# Patient Record
Sex: Female | Born: 1960
Health system: Southern US, Community
[De-identification: ages and names within clinical notes are randomized; demographics above are authoritative.]

## PROBLEM LIST (undated history)

## (undated) DIAGNOSIS — K5900 Constipation, unspecified: Secondary | ICD-10-CM

## (undated) DIAGNOSIS — F319 Bipolar disorder, unspecified: Secondary | ICD-10-CM

## (undated) DIAGNOSIS — R51 Headache: Secondary | ICD-10-CM

## (undated) DIAGNOSIS — F419 Anxiety disorder, unspecified: Secondary | ICD-10-CM

## (undated) DIAGNOSIS — E039 Hypothyroidism, unspecified: Secondary | ICD-10-CM

## (undated) DIAGNOSIS — R1013 Epigastric pain: Secondary | ICD-10-CM

## (undated) DIAGNOSIS — R140 Abdominal distension (gaseous): Secondary | ICD-10-CM

## (undated) DIAGNOSIS — R600 Localized edema: Secondary | ICD-10-CM

## (undated) DIAGNOSIS — Z1211 Encounter for screening for malignant neoplasm of colon: Secondary | ICD-10-CM

## (undated) DIAGNOSIS — R1310 Dysphagia, unspecified: Secondary | ICD-10-CM

## (undated) DIAGNOSIS — F329 Major depressive disorder, single episode, unspecified: Secondary | ICD-10-CM

## (undated) DIAGNOSIS — F32A Depression, unspecified: Secondary | ICD-10-CM

## (undated) DIAGNOSIS — K2210 Ulcer of esophagus without bleeding: Secondary | ICD-10-CM

## (undated) DIAGNOSIS — R109 Unspecified abdominal pain: Secondary | ICD-10-CM

## (undated) DIAGNOSIS — K219 Gastro-esophageal reflux disease without esophagitis: Secondary | ICD-10-CM

## (undated) DIAGNOSIS — R05 Cough: Secondary | ICD-10-CM

## (undated) HISTORY — DX: Localized edema: R60.0

## (undated) HISTORY — DX: Ulcer of esophagus without bleeding: K22.10

## (undated) HISTORY — DX: Epigastric pain: R10.13

## (undated) HISTORY — DX: Cough: R05

## (undated) HISTORY — DX: Unspecified abdominal pain: R10.9

## (undated) HISTORY — PX: OTHER SURGICAL HISTORY: SHX169

## (undated) HISTORY — DX: Bipolar disorder, unspecified: F31.9

## (undated) HISTORY — PX: CHOLECYSTECTOMY: SHX55

## (undated) HISTORY — DX: Constipation, unspecified: K59.00

## (undated) HISTORY — DX: Gastro-esophageal reflux disease without esophagitis: K21.9

## (undated) HISTORY — DX: Dysphagia, unspecified: R13.10

## (undated) HISTORY — DX: Abdominal distension (gaseous): R14.0

## (undated) HISTORY — DX: Encounter for screening for malignant neoplasm of colon: Z12.11

---

## 1999-08-31 ENCOUNTER — Emergency Department (HOSPITAL_COMMUNITY): Admission: EM | Admit: 1999-08-31 | Discharge: 1999-08-31 | Payer: Self-pay | Admitting: *Deleted

## 1999-08-31 ENCOUNTER — Inpatient Hospital Stay (HOSPITAL_COMMUNITY): Admission: EM | Admit: 1999-08-31 | Discharge: 1999-09-03 | Payer: Self-pay | Admitting: *Deleted

## 1999-09-04 ENCOUNTER — Other Ambulatory Visit: Admission: RE | Admit: 1999-09-04 | Discharge: 1999-09-15 | Payer: Self-pay

## 2001-01-28 ENCOUNTER — Ambulatory Visit (HOSPITAL_COMMUNITY): Admission: RE | Admit: 2001-01-28 | Discharge: 2001-01-28 | Payer: Self-pay | Admitting: Endocrinology

## 2001-01-29 ENCOUNTER — Encounter: Payer: Self-pay | Admitting: Endocrinology

## 2001-08-25 ENCOUNTER — Other Ambulatory Visit: Admission: RE | Admit: 2001-08-25 | Discharge: 2001-08-25 | Payer: Self-pay | Admitting: Obstetrics and Gynecology

## 2002-10-01 ENCOUNTER — Encounter (INDEPENDENT_AMBULATORY_CARE_PROVIDER_SITE_OTHER): Payer: Self-pay | Admitting: Specialist

## 2002-10-01 ENCOUNTER — Observation Stay (HOSPITAL_COMMUNITY): Admission: RE | Admit: 2002-10-01 | Discharge: 2002-10-02 | Payer: Self-pay | Admitting: General Surgery

## 2002-10-01 ENCOUNTER — Encounter: Payer: Self-pay | Admitting: General Surgery

## 2002-12-03 ENCOUNTER — Ambulatory Visit (HOSPITAL_COMMUNITY): Admission: RE | Admit: 2002-12-03 | Discharge: 2002-12-03 | Payer: Self-pay | Admitting: Endocrinology

## 2005-05-24 ENCOUNTER — Emergency Department (HOSPITAL_COMMUNITY): Admission: EM | Admit: 2005-05-24 | Discharge: 2005-05-24 | Payer: Self-pay | Admitting: Emergency Medicine

## 2005-09-12 ENCOUNTER — Inpatient Hospital Stay (HOSPITAL_COMMUNITY): Admission: EM | Admit: 2005-09-12 | Discharge: 2005-09-16 | Payer: Self-pay | Admitting: Psychiatry

## 2005-09-12 ENCOUNTER — Emergency Department (HOSPITAL_COMMUNITY): Admission: EM | Admit: 2005-09-12 | Discharge: 2005-09-12 | Payer: Self-pay | Admitting: Emergency Medicine

## 2005-09-12 ENCOUNTER — Ambulatory Visit: Payer: Self-pay | Admitting: Psychiatry

## 2005-09-17 ENCOUNTER — Other Ambulatory Visit (HOSPITAL_COMMUNITY): Admission: RE | Admit: 2005-09-17 | Discharge: 2005-10-01 | Payer: Self-pay | Admitting: Psychiatry

## 2005-10-26 ENCOUNTER — Ambulatory Visit: Payer: Self-pay | Admitting: Psychiatry

## 2005-11-20 ENCOUNTER — Ambulatory Visit (HOSPITAL_COMMUNITY): Payer: Self-pay | Admitting: Psychiatry

## 2005-11-20 ENCOUNTER — Ambulatory Visit: Payer: Self-pay | Admitting: Psychiatry

## 2006-01-04 ENCOUNTER — Ambulatory Visit (HOSPITAL_COMMUNITY): Payer: Self-pay | Admitting: Psychiatry

## 2006-03-01 ENCOUNTER — Ambulatory Visit (HOSPITAL_COMMUNITY): Payer: Self-pay | Admitting: Psychiatry

## 2006-05-03 ENCOUNTER — Ambulatory Visit (HOSPITAL_COMMUNITY): Payer: Self-pay | Admitting: Psychiatry

## 2006-08-09 ENCOUNTER — Ambulatory Visit (HOSPITAL_COMMUNITY): Payer: Self-pay | Admitting: *Deleted

## 2006-09-19 ENCOUNTER — Ambulatory Visit (HOSPITAL_COMMUNITY): Admission: RE | Admit: 2006-09-19 | Discharge: 2006-09-19 | Payer: Self-pay | Admitting: Family Medicine

## 2006-11-01 ENCOUNTER — Ambulatory Visit (HOSPITAL_COMMUNITY): Payer: Self-pay | Admitting: Psychiatry

## 2006-12-08 ENCOUNTER — Inpatient Hospital Stay (HOSPITAL_COMMUNITY): Admission: EM | Admit: 2006-12-08 | Discharge: 2006-12-09 | Payer: Self-pay | Admitting: Psychiatry

## 2006-12-08 ENCOUNTER — Ambulatory Visit: Payer: Self-pay | Admitting: Psychiatry

## 2006-12-12 ENCOUNTER — Ambulatory Visit (HOSPITAL_COMMUNITY): Payer: Self-pay | Admitting: Psychiatry

## 2007-04-18 ENCOUNTER — Ambulatory Visit (HOSPITAL_COMMUNITY): Payer: Self-pay | Admitting: Psychiatry

## 2007-05-15 ENCOUNTER — Ambulatory Visit (HOSPITAL_COMMUNITY): Payer: Self-pay | Admitting: Psychiatry

## 2007-05-19 ENCOUNTER — Ambulatory Visit: Payer: Self-pay | Admitting: Psychiatry

## 2007-05-19 ENCOUNTER — Other Ambulatory Visit (HOSPITAL_COMMUNITY): Admission: RE | Admit: 2007-05-19 | Discharge: 2007-06-02 | Payer: Self-pay | Admitting: Psychiatry

## 2007-06-19 ENCOUNTER — Ambulatory Visit (HOSPITAL_COMMUNITY): Payer: Self-pay | Admitting: Psychiatry

## 2007-10-06 ENCOUNTER — Emergency Department (HOSPITAL_COMMUNITY): Admission: EM | Admit: 2007-10-06 | Discharge: 2007-10-06 | Payer: Self-pay | Admitting: Emergency Medicine

## 2007-10-07 ENCOUNTER — Ambulatory Visit (HOSPITAL_COMMUNITY): Admission: RE | Admit: 2007-10-07 | Discharge: 2007-10-07 | Payer: Self-pay | Admitting: Family Medicine

## 2007-10-31 ENCOUNTER — Encounter (HOSPITAL_COMMUNITY): Admission: RE | Admit: 2007-10-31 | Discharge: 2007-11-30 | Payer: Self-pay | Admitting: Family Medicine

## 2008-03-10 ENCOUNTER — Ambulatory Visit (HOSPITAL_COMMUNITY): Payer: Self-pay | Admitting: Psychiatry

## 2008-09-03 ENCOUNTER — Ambulatory Visit (HOSPITAL_COMMUNITY): Payer: Self-pay | Admitting: Psychiatry

## 2009-02-08 ENCOUNTER — Ambulatory Visit (HOSPITAL_COMMUNITY): Admission: RE | Admit: 2009-02-08 | Discharge: 2009-02-08 | Payer: Self-pay | Admitting: Endocrinology

## 2009-02-19 ENCOUNTER — Encounter: Admission: RE | Admit: 2009-02-19 | Discharge: 2009-02-19 | Payer: Self-pay | Admitting: Endocrinology

## 2009-02-25 ENCOUNTER — Ambulatory Visit (HOSPITAL_COMMUNITY): Payer: Self-pay | Admitting: Psychiatry

## 2009-08-31 ENCOUNTER — Ambulatory Visit (HOSPITAL_COMMUNITY): Payer: Self-pay | Admitting: Psychiatry

## 2010-02-21 ENCOUNTER — Ambulatory Visit (HOSPITAL_COMMUNITY): Payer: Self-pay | Admitting: Psychiatry

## 2010-08-30 ENCOUNTER — Ambulatory Visit (HOSPITAL_COMMUNITY): Payer: Self-pay | Admitting: Psychiatry

## 2011-02-23 ENCOUNTER — Encounter (HOSPITAL_COMMUNITY): Payer: 59 | Admitting: Physician Assistant

## 2011-02-23 DIAGNOSIS — F319 Bipolar disorder, unspecified: Secondary | ICD-10-CM

## 2011-04-03 LAB — BASIC METABOLIC PANEL
BUN: 7 mg/dL (ref 6–23)
CO2: 25 mEq/L (ref 19–32)
Calcium: 9.2 mg/dL (ref 8.4–10.5)
Chloride: 106 mEq/L (ref 96–112)
Creatinine, Ser: 0.8 mg/dL (ref 0.4–1.2)
GFR calc Af Amer: >60 mL/min (ref >60)
Sodium: 137 mEq/L (ref 135–145)

## 2011-04-03 LAB — ACTH STIMULATION, 3 TIME POINTS
Cortisol, 60 Min: 29.2 ug/dL (ref >20)
Cortisol, Base: 11.5 ug/dL

## 2011-05-04 NOTE — Discharge Summary (Signed)
Whitney Lopez, Whitney Lopez               ACCOUNT NO.:  [id number]   MEDICAL RECORD NO.:  [id number]          PATIENT TYPE:  IPS   LOCATION:  0501                          [facility]ACILITY[facility]  [facility]   PHYSICIAN:  Jeanice Lim, M.D. DATE OF BIRTH:  [birth year]   DATE OF ADMISSION:  09/12/2005  DATE OF DISCHARGE:  09/16/2005                                 DISCHARGE SUMMARY   IDENTIFYING DATA:  This is a 50 year old widowed Caucasian female  voluntarily admitted, presenting with history of suicidal thoughts.  Reporting recent overdose on Sunday of Xanax and Valium, tried to asphyxiate  herself with carbon monoxide.  Lost husband in March, unable to cope, cannot  concentrate, unable to go to work, having some racing thoughts.  Had  intentionally lost 61 pounds for health reasons.   MEDICATIONS:  Synthroid, Cytomel, triamterene, Xanax.   ALLERGIES:  PENICILLIN.   PHYSICAL EXAMINATION:  Physical and neurologic exam within normal limits.   LABORATORY DATA:  Routine admission labs within normal limits.   MENTAL STATUS EXAM:  Alert, cooperative.  Good eye contact.  Speech clear.  Mood depressed, anxious.  Affect tearful at times.  Thought processes goal  directed with some agitated thinking.  Cognitively intact.  Judgment and  insight fair.   ADMISSION DIAGNOSES:  AXIS I:  Major depressive disorder, recurrent, severe.  AXIS II:  Deferred.  AXIS III:  Thyroid history.  AXIS IV:  Moderate (problems with limited support system, psychosocial  issues).  AXIS V:  30/65.   HOSPITAL COURSE:  The patient was admitted and ordered routine p.r.n.  medications and underwent further monitoring.  Was encouraged to participate  in individual, group and milieu therapy.  Was monitored for safety.  Evaluated regarding medications and arranged for intensive outpatient  treatment.  Participated in aftercare planning.  Reported improvement in  sleeping, resolution of suicidal ideation.  Pressured speech and racing  thoughts slowed down and patient felt stable.   CONDITION ON DISCHARGE:  The patient was discharge feeling I'm 100%  better.  The patient was discharged in improved condition.  Mood was  euthymic.  No lability.  Thought processes goal directed with no pressure or  flight of ideas.  No dangerous ideation.  No psychotic symptoms.  The  patient was given medication education.  Recommended not to take Cytomel and  to follow up with Dr. __________ on September 18, 2005 at 1:45 p.m. due to  questionable slightly hyperthyroid state and patient, again, was given  medication education.   DISCHARGE MEDICATIONS:  1.  Risperdal 0.5 mg, 1 at 8 p.m.  2.  Lamictal 25 mg in the morning.  3.  Topamax 25 mg at night.  4.  Seroquel 100 mg at night.  5.  Ambien 10 mg, 1-1/2 at night.  6.  Synthroid 100 mcg on Monday, Wednesday and Friday.  7.  Synthroid 200 mcg on Thursday, Saturday and Sunday.   Again, hold Cytomel until follow up with outpatient Gizelle Whetsel.   1.  Maxzide 37.5/25 mg in the morning.   FOLLOW UP:  The patient was to follow up with [facility]  __________ and Jonny Ruiz  __________ at [facility] on October 11th at 9:30 a.m. and October 6th at 10  a.m.  The patient was to start with intensive outpatient at [facility] at 9 a.m.   DISCHARGE DIAGNOSES:  AXIS I:  Major depressive disorder, recurrent, severe.  AXIS II:  Deferred.  AXIS III:  Thyroid history.  AXIS IV:  Moderate (problems with limited support system, psychosocial  issues).  AXIS V:  GAF on discharge 55.      Jeanice Lim, M.D.  Electronically Signed     JEM/MEDQ  D:  10/19/2005  T:  10/19/2005  Job:  [id number]

## 2011-05-04 NOTE — Op Note (Signed)
NAME:  Whitney Lopez, Whitney Lopez                         ACCOUNT NO.:  [id number]   MEDICAL RECORD NO.:  [id number]                   PATIENT TYPE:  OBV   LOCATION:  0440                                 FACILITY:  [facility]   PHYSICIAN:  Ollen Gross. Vernell Morgans, M.D.              DATE OF BIRTH:  Apr 22, 1961   DATE OF PROCEDURE:  10/01/2002  DATE OF DISCHARGE:  10/02/2002                                 OPERATIVE REPORT   PREOPERATIVE DIAGNOSES:  Cholelithiasis.   POSTOPERATIVE DIAGNOSES:  Cholelithiasis.   PROCEDURE:  Laparoscopic cholecystectomy with intraoperative cholangiogram.   SURGEON:  Ollen Gross. Carolynne Edouard, M.D.   ASSISTANT:  Anselm Pancoast. Zachery Dakins, M.D.   ANESTHESIA:  General endotracheal.   DESCRIPTION OF PROCEDURE:  After informed consent was obtained, the patient  was brought to the operating room, placed in supine position on the  operating room table. After adequate induction of general anesthesia, the  patient's abdomen was prepped with Betadine and draped in the usual sterile  manner. The area of the umbilicus was infiltrated with 1% lidocaine with a  0.25% Marcaine. A small incision was made with a 15 blade knife. This  incision was carried down through the subcutaneous tissue bluntly with Kelly  clamps and [employer] retractors until the linea alba was identified. The  linea alba was incised with a 15 blade knife and each side was grasped with  Kocher clamps and elevated anteriorly. The preperitoneal space was then  probed bluntly with a hemostat until the peritoneum was opened and access  was gained to the abdominal cavity. A finger was inserted through this  opening and there were no obvious adhesions to the anterior abdominal wall.  A #0 Vicryl pursestring stitch was placed in the fascia surrounding this  opening. A Hasson cannula was then placed through this opening and anchored  in place with the previously placed Vicryl pursestring stitch. The abdomen  was then insufflated with carbon  dioxide without difficulty. The patient was  placed in the head up position and the laparoscope was placed through the  Hasson cannula. The dome of the gallbladder and liver edge were readily  identified. Next after infiltrating the area with 0.25% Marcaine, a small  midline transverse incision was made with a 15 blade knife. A 10 mm port was  then placed bluntly through this incision into the abdominal cavity under  direct vision. Sites were then chosen laterally on the right side of the  abdomen for placement of 5 mm ports and each of these areas was infiltrated  with 0.25% Marcaine and small stab incisions were made with a 15 blade  knife. The 5 mm ports were then placed bluntly through these incisions into  the abdominal cavity under direct vision. A blunt grasper was placed through  the lateral most 5 mm port and used to grasp the dome of the gallbladder and  elevate it anteriorly and superiorly. Another  blunt grasper was placed  through the other 5 mm port and used to retract on the body and neck of the  gallbladder. The dissector was then placed through the upper midline port  and the peritoneal reflection at the gallbladder neck was then opened using  the electrocautery. Blunt dissection was then carried out in this area until  the gallbladder neck cystic duct junction was readily identified and  dissected in a circumferential manner. A good window was created and care  was taken to keep the common duct medial to this dissection. A clip was  placed on the gallbladder neck. A small ductotomy was made with the  laparoscopic scissors. A 12 gauge angiocath was placed percutaneously  through the anterior abdominal wall and the Reddick catheter was placed  through the angiocath. The Reddick catheter was placed within the cystic  duct and anchored in place with the clip. A cholangiogram was obtained that  showed no filling defects in the biliary system and good emptying into the  duodenum.  The anchoring clip and Reddick catheter and angiocath were then  removed from the patient. Three clips were then placed proximally on the  cystic duct and the duct was divided between the two. Posterior to this, the  cystic artery was identified and again dissected in a circumferential manner  bluntly until a good window was created. Two clips were placed proximally  and one distally on the artery and the artery was divided between the two.  Next, the laparoscopic Adriana Simas device was used to separate the gallbladder from  the liver bed. Prior to completely detaching the gallbladder from the liver  bed, the liver bed was inspected and several small bleeding points were  coagulated with the electrocautery. The gallbladder was then detached the  rest of the way from the liver bed. An endoscopic bag was placed through the  upper midline port and the gallbladder was placed within the bag and sealed.  The laparoscope was then moved to the upper midline port and a gallbladder  grasper was placed through the Hasson cannula and used to grasp the opening  of the bag. The bag with the gallbladder was then removed with the Hasson  cannula through the infraumbilical port. The fascial defect was then closed  with the previously placed Vicryl pursestring stitch. The abdomen was then  irrigated with copious amounts of saline until the affluent was clear. The  liver bed was inspected again and found to be hemostatic. The ports were  then removed and the gas was allowed to escape. The incisions were then  closed with interrupted 4-0 Monocryl subcuticular stitches. Benzoin and  Steri-Strips and sterile dressings were applied. The patient tolerated the  procedure well. At the end of the case, all sponge, needle and instrument  counts were correct. The patient was then awakened and taken to the recovery  room in stable condition.                                               Ollen Gross. Vernell Morgans, M.D.    PST/MEDQ   D:  10/12/2002  T:  10/12/2002  Job:  [id number]

## 2011-05-04 NOTE — Discharge Summary (Signed)
Whitney Lopez, Whitney Lopez               ACCOUNT NO.:  [id number]   MEDICAL RECORD NO.:  [id number]          PATIENT TYPE:  IPS   LOCATION:  0301                          [facility]ILITY[facility]  [facility]   PHYSICIAN:  Anselm Jungling, MD  DATE OF BIRTH:  Jun 26, 1961   DATE OF ADMISSION:  12/08/2006  DATE OF DISCHARGE:  12/09/2006                               DISCHARGE SUMMARY   IDENTIFYING DATA AND REASON FOR ADMISSION:  The patient is a 50 year old  single white female.  This was her second [facility] admission.  She again  overdosed, this time on 120 Vistaril.  She had been feeling hopeless and  overwhelmed following the death of her husband.  Please refer to the  admission note for further details pertaining to the symptoms,  circumstances and history that led to her hospitalization.  She was  given an initial Axis I diagnosis of major depressive disorder,  recurrent, and bereavement.   MEDICAL AND LABORATORY:  The patient was medically and physically  assessed by the psychiatric nurse practitioner.  She had a history of  thyroid disease, and was continued on her usual Cytomel 10 mg daily.  She was continued on a regimen of Synthroid 100 mcg daily alternating  with 200 mcg daily.  There were no acute medical issues.   HOSPITAL COURSE:  The patient was admitted to the adult inpatient  psychiatric service.  She presented as a fully alert adult female who  was coherent, with intact cognition and good judgment and insight.  She  indicated that she did not feel she needed the inpatient hospital on the  morning of December 09, 2006.  A family session was arranged for that  afternoon.  The patient's mother and stepfather were present, along with  the patient.  The discharge plan was put in place including safety  precautions.  The plan was to stay with her friend Lynden Ang with safety  checks by parents via telephone or in person twice daily as long as  needed.  Thereafter, the patient would be going back to work  and  beginning school.  The patient reported not wanting mother's assistance  except for telephone contact, but agreed to her stepfather's assistance.  The patient and stepfather agreed to continue contact on a regular basis  rather than the patient contacting him only in a crisis.  The meeting  went on with discussion of the patient having an appointment with a  psychiatrist and the plan to increase her monthly sessions with her  outpatient therapist to weekly for 4 weeks, and then assess the need for  continued weekly sessions as needed.  Both psychiatrist and therapist  had been contacted regarding the suicide attempt.  The patient also  agreed to join a support group for bipolar disorder.  She made it a goal  in therapy to address her depression and grief, pertaining to the loss  of her husband that occurred less than 2 years prior.  There was also  discussion about the possibility of family therapy conjointly with her  mother at some later date.   Following this meeting,  the patient was discharged per the plan above.   AFTERCARE:  The patient was to follow-up for medication management with  Jorje Guild, appointment time to be arranged.   DISCHARGE MEDICATIONS:  Lamictal 150 mg daily, Risperdal 1 mg q.h.s.,  Cytomel 10 mg q.h.s., and Synthroid 100 mcg every other day alternating  with 200 mcg every other day.   DISCHARGE DIAGNOSES:  AXIS I: Bipolar disorder, type 2.   AXIS II: Deferred.   AXIS III: Thyroid disorder.   AXIS IV: Stressors severe.   AXIS V: GAF on discharge 65.      Anselm Jungling, MD  Electronically Signed     SPB/MEDQ  D:  12/27/2006  T:  12/29/2006  Job:  [id number]

## 2011-09-04 ENCOUNTER — Encounter (HOSPITAL_COMMUNITY): Payer: 59 | Admitting: Physician Assistant

## 2011-10-03 ENCOUNTER — Encounter (HOSPITAL_COMMUNITY): Payer: 59 | Admitting: Physician Assistant

## 2011-10-03 DIAGNOSIS — F319 Bipolar disorder, unspecified: Secondary | ICD-10-CM

## 2011-10-11 ENCOUNTER — Ambulatory Visit (INDEPENDENT_AMBULATORY_CARE_PROVIDER_SITE_OTHER): Payer: 59 | Admitting: Gastroenterology

## 2011-10-11 ENCOUNTER — Encounter: Payer: Self-pay | Admitting: Gastroenterology

## 2011-10-11 VITALS — BP 108/68 | HR 77 | Temp 98.0°F | Ht 70.0 in | Wt 224.6 lb

## 2011-10-11 DIAGNOSIS — R1013 Epigastric pain: Secondary | ICD-10-CM

## 2011-10-11 HISTORY — DX: Epigastric pain: R10.13

## 2011-10-11 MED ORDER — OMEPRAZOLE 20 MG PO CPDR: 20.0000 mg | DELAYED_RELEASE_CAPSULE | Freq: Every day | ORAL | Status: DC

## 2011-10-11 NOTE — Assessment & Plan Note (Signed)
50 year old female with 7 month hx of early satiety, nausea, and epigastric discomfort after eating. Feels like a "knot", relieved at times by laying down and stretching. No vomiting, does report sensation of food "sitting heavy" in epigastric region. No reflux. No routine use of NSAIDs or aspirin powders. Denies melena or hematochezia. No anemia on labs, H.pylori antibody negative. Needs EGD for further evaluation of symptoms. Will institute PPI in interim.   Proceed with upper endoscopy in the near future with Dr. Jena Gauss. The risks, benefits, and alternatives have been discussed in detail with patient. They have stated understanding and desire to proceed.  Prilosec 20 mg daily Further recommendations to follow in the future

## 2011-10-11 NOTE — Progress Notes (Signed)
Referring Provider: Cassell Smiles., MD Primary Care Physician:  Cassell Smiles., MD Primary Gastroenterologist:  Dr. Jena Gauss   Chief Complaint  Patient presents with  . Nausea  . Pain    food stops in center of chest    HPI:   Ms. Whitney Lopez is a pleasant 50 year old female who presents today at the request of Dr. Sherwood Gambler secondary to early satiety and nausea. Her husband was actually a patient of ours several years ago; he unfortunately succumbed to what sounds like some type of cancer. She is quite pleased with Dr. Jena Gauss and would like him to take care of her as well. She reports a 7 month history of early satiety, nausea, epigastric discomfort after eating. Described as a "knot". Denies any vomiting. She feels like food "sits" in the epigastric region. At times she will lay down to feel better. Denies any reflux. No routine use of NSAIDs or aspirin powders. Denies melena or hematochezia.  She has never had an EGD.  As of note, she does report chronic lower extremity edema, since age 63. She states no one has been able to pinpoint the problem.   Outside labs Oct 2012: H.pylori antibody negative Hgb, Hct normal  Past Medical History  Diagnosis Date  . Bipolar disorder   . Lower extremity edema     Past Surgical History  Procedure Date  . S/p hysterectomy   . Clavicle repair   . Cholecystectomy     Current Outpatient Prescriptions  Medication Sig Dispense Refill  . lamoTRIgine (LAMICTAL) 200 MG tablet Take 200 mg by mouth daily.        Marland Kitchen levothyroxine (SYNTHROID, LEVOTHROID) 175 MCG tablet Take 175 mcg by mouth daily.        . risperiDONE (RISPERDAL) 2 MG tablet Take 2 mg by mouth 2 (two) times daily.        Marland Kitchen omeprazole (PRILOSEC) 20 MG capsule Take 1 capsule (20 mg total) by mouth daily.  31 capsule  1    Allergies as of 10/11/2011 - Review Complete 10/11/2011  Allergen Reaction Noted  . Penicillins Hives 10/11/2011    Family History  Problem Relation Age of Onset   . Colon cancer Neg Hx   . Ulcers Mother     History   Social History  . Marital Status: Widowed    Spouse Name: N/A    Number of Children: 0  . Years of Education: N/A   Occupational History  .  [employer]   Social History Main Topics  . Smoking status: Current Everyday Smoker -- 1.0 packs/day    Types: Cigarettes  . Smokeless tobacco: Not on file  . Alcohol Use: No  . Drug Use: No  . Sexually Active: Not on file   Other Topics Concern  . Not on file   Social History Narrative  . No narrative on file    Review of Systems: Gen: Denies any fever, chills, loss of appetite, fatigue, weight loss. CV: Denies chest pain, heart palpitations, syncope, peripheral edema. Resp: Denies shortness of breath with rest, cough, wheezing GI: SEE HPI GU : Denies urinary burning, urinary frequency, urinary incontinence.  MS: Denies joint pain, muscle weakness, cramps, limited movement Derm: Denies rash, itching, dry skin Psych: Denies depression, anxiety, confusion or memory loss  Heme: Denies bruising, bleeding, and enlarged lymph nodes.  Physical Exam: BP 108/68  Pulse 77  Temp(Src) 98 F (36.7 C) (Temporal)  Ht 5\' 10"  (1.778 m)  Wt 224 lb 9.6 oz (101.878  kg)  BMI 32.23 kg/m2 General:   Alert and oriented. Well-developed, well-nourished, pleasant and cooperative. Head:  Normocephalic and atraumatic. Eyes:  Conjunctiva pink, sclera clear, no icterus.    Ears:  Normal auditory acuity. Nose:  No deformity, discharge,  or lesions. Mouth:  No deformity or lesions, mucosa pink and moist.  Neck:  Supple, without mass or thyromegaly. Lungs:  Clear to auscultation bilaterally, without wheezing, rales, or rhonchi.  Heart:  S1, S2 present without murmurs noted.  Abdomen:  +BS, soft, non-tender and non-distended. Without mass or HSM. No rebound or guarding. No hernias noted. Rectal:  Deferred  Msk:  Symmetrical without gross deformities. Normal posture. Extremities:  Without  clubbing. Bilateral lower extremity edema extending from knee to foot, significant pedal edema, no significant pitting. No venous stasis changes.  Neurologic:  Alert and  oriented x4;  grossly normal neurologically. Skin:  Intact, warm and dry without significant lesions or rashes Cervical Nodes:  No significant cervical adenopathy. Psych:  Alert and cooperative. Normal mood and affect.

## 2011-10-11 NOTE — Patient Instructions (Signed)
Start taking Prilosec 30 minutes before your first meal of the day.   We have set you up with Dr. Jena Gauss for an upper endoscopy.  In the meantime, contact Dr. Sherwood Gambler about the lower extremity swelling. They may need to refer you to Physical Therapy.

## 2011-10-12 NOTE — Progress Notes (Signed)
Cc to PCP 

## 2011-10-30 ENCOUNTER — Encounter (HOSPITAL_COMMUNITY): Payer: Self-pay | Admitting: Pharmacy Technician

## 2011-11-01 ENCOUNTER — Other Ambulatory Visit: Payer: Self-pay | Admitting: General Practice

## 2011-11-01 DIAGNOSIS — R1013 Epigastric pain: Secondary | ICD-10-CM

## 2011-11-01 DIAGNOSIS — R1310 Dysphagia, unspecified: Secondary | ICD-10-CM

## 2011-11-01 DIAGNOSIS — K210 Gastro-esophageal reflux disease with esophagitis: Secondary | ICD-10-CM

## 2011-11-01 MED ORDER — SODIUM CHLORIDE 0.45 % IV SOLN
Freq: Once | INTRAVENOUS | Status: AC
  Administered 2011-11-02: 09:00:00 via INTRAVENOUS

## 2011-11-02 ENCOUNTER — Encounter (HOSPITAL_COMMUNITY)
Admission: RE | Disposition: A | Discharge status: Home / Self Care | Payer: Self-pay | Source: Ambulatory Visit | Attending: Internal Medicine

## 2011-11-02 ENCOUNTER — Other Ambulatory Visit: Payer: Self-pay | Admitting: Internal Medicine

## 2011-11-02 ENCOUNTER — Encounter (HOSPITAL_COMMUNITY): Payer: Self-pay | Admitting: *Deleted

## 2011-11-02 ENCOUNTER — Ambulatory Visit (HOSPITAL_COMMUNITY)
Admission: RE | Admit: 2011-11-02 | Discharge: 2011-11-02 | Disposition: A | Discharge status: Home / Self Care | Payer: 59 | Source: Ambulatory Visit | Attending: Internal Medicine | Admitting: Internal Medicine

## 2011-11-02 DIAGNOSIS — R1310 Dysphagia, unspecified: Secondary | ICD-10-CM | POA: Insufficient documentation

## 2011-11-02 DIAGNOSIS — K2970 Gastritis, unspecified, without bleeding: Secondary | ICD-10-CM | POA: Insufficient documentation

## 2011-11-02 DIAGNOSIS — K2100 Gastro-esophageal reflux disease with esophagitis, without bleeding: Secondary | ICD-10-CM | POA: Insufficient documentation

## 2011-11-02 DIAGNOSIS — R1013 Epigastric pain: Secondary | ICD-10-CM | POA: Insufficient documentation

## 2011-11-02 DIAGNOSIS — K2990 Gastroduodenitis, unspecified, without bleeding: Secondary | ICD-10-CM | POA: Insufficient documentation

## 2011-11-02 DIAGNOSIS — R110 Nausea: Secondary | ICD-10-CM | POA: Insufficient documentation

## 2011-11-02 HISTORY — DX: Depression, unspecified: F32.A

## 2011-11-02 HISTORY — PX: MALONEY DILATION: SHX5535

## 2011-11-02 HISTORY — PX: ESOPHAGOGASTRODUODENOSCOPY: SHX5428

## 2011-11-02 HISTORY — DX: Anxiety disorder, unspecified: F41.9

## 2011-11-02 HISTORY — DX: Headache: R51

## 2011-11-02 HISTORY — DX: Major depressive disorder, single episode, unspecified: F32.9

## 2011-11-02 HISTORY — DX: Hypothyroidism, unspecified: E03.9

## 2011-11-02 SURGERY — EGD (ESOPHAGOGASTRODUODENOSCOPY)
Anesthesia: Moderate Sedation | Site: Esophagus

## 2011-11-02 MED ORDER — STERILE WATER FOR IRRIGATION IR SOLN
Status: DC | PRN
  Administered 2011-11-02: 10:00:00

## 2011-11-02 MED ORDER — MIDAZOLAM HCL 5 MG/5ML IJ SOLN
INTRAMUSCULAR | Status: DC | PRN
  Administered 2011-11-02: 1 mg via INTRAVENOUS
  Administered 2011-11-02 (×2): 2 mg via INTRAVENOUS

## 2011-11-02 MED ORDER — MEPERIDINE HCL 100 MG/ML IJ SOLN
INTRAMUSCULAR | Status: DC | PRN
  Administered 2011-11-02: 25 mg via INTRAVENOUS
  Administered 2011-11-02: 50 mg via INTRAVENOUS
  Administered 2011-11-02: 25 mg via INTRAVENOUS

## 2011-11-02 MED ORDER — BUTAMBEN-TETRACAINE-BENZOCAINE 2-2-14 % EX AERO
INHALATION_SPRAY | CUTANEOUS | Status: DC | PRN
  Administered 2011-11-02: 2 via TOPICAL

## 2011-11-02 MED ORDER — MIDAZOLAM HCL 5 MG/5ML IJ SOLN
INTRAMUSCULAR | Status: AC
  Filled 2011-11-02: qty 10

## 2011-11-02 MED ORDER — MEPERIDINE HCL 100 MG/ML IJ SOLN
INTRAMUSCULAR | Status: AC
  Filled 2011-11-02: qty 2

## 2011-11-02 NOTE — H&P (Signed)
  I have seen & examined the patient prior to the procedure(s) today and reviewed the history and physical/consultation.  There have been no changes.  After consideration of the risks, benefits, alternatives and imponderables, the patient has consented to the procedure(s).   

## 2011-11-11 ENCOUNTER — Encounter: Payer: Self-pay | Admitting: Internal Medicine

## 2011-11-12 ENCOUNTER — Encounter (HOSPITAL_COMMUNITY): Payer: Self-pay | Admitting: Internal Medicine

## 2011-11-15 ENCOUNTER — Encounter: Payer: Self-pay | Admitting: Internal Medicine

## 2011-11-30 ENCOUNTER — Encounter: Payer: Self-pay | Admitting: Urgent Care

## 2011-11-30 ENCOUNTER — Ambulatory Visit (INDEPENDENT_AMBULATORY_CARE_PROVIDER_SITE_OTHER): Payer: 59 | Admitting: Urgent Care

## 2011-11-30 VITALS — BP 97/72 | HR 77 | Temp 97.2°F | Ht 70.0 in | Wt 215.2 lb

## 2011-11-30 DIAGNOSIS — K208 Other esophagitis without bleeding: Secondary | ICD-10-CM

## 2011-11-30 DIAGNOSIS — K2210 Ulcer of esophagus without bleeding: Secondary | ICD-10-CM

## 2011-11-30 DIAGNOSIS — R1013 Epigastric pain: Secondary | ICD-10-CM

## 2011-11-30 HISTORY — DX: Ulcer of esophagus without bleeding: K22.10

## 2011-11-30 MED ORDER — DEXLANSOPRAZOLE 60 MG PO CPDR: 60.0000 mg | DELAYED_RELEASE_CAPSULE | Freq: Every day | ORAL | Status: DC

## 2011-11-30 NOTE — Assessment & Plan Note (Addendum)
Begin Dexilant 60 mg daily. Avoid NSAIDs. GERD precautions literature. Discussed raising the head of her bed and eating at least 4 hours prior to lying down in the morning after work. Office visit in one year with Dr. Jena Gauss or sooner if needed.  Screening Colonoscopy September 2014

## 2011-11-30 NOTE — Assessment & Plan Note (Signed)
Due to erosive esophagitis. Improved.

## 2011-11-30 NOTE — Progress Notes (Signed)
Cc to PCP 

## 2011-11-30 NOTE — Progress Notes (Signed)
Referring Provider: Cassell Smiles., MD Primary Care Physician:  Cassell Smiles., MD Primary Gastroenterologist:  Dr. Jena Gauss  Chief Complaint  Patient presents with  . Follow-up    Erosive reflux esophagitis    HPI:  Whitney Lopez is a 50 y.o. female here for follow up for erosive reflux esophagitis and dysphagia. She is status post esophageal dilation with a 54 [place] Maloney dilator. She tells me she received a letter about the biopsy showing mild gastritis, therefore she threw the letter away and stopped taking PPI. Tells me she is "Getting over flu."  Chicken & dumplings caused horrible acid reflux.  Dysphagia better.  Some N/V w/ flu, but now resolved.  Denies melena or hematochezia.  Appetite getting back to normal.    Past Medical History  Diagnosis Date  . Bipolar disorder   . Lower extremity edema   . Hypothyroidism   . Headache   . Depression   . Anxiety   . GERD (gastroesophageal reflux disease)     Erosive reflux esophagitis    Past Surgical History  Procedure Date  . S/p hysterectomy   . Clavicle repair   . Cholecystectomy   . Abdominal hysterectomy   . Esophagogastroduodenoscopy 11/02/2011    Four-quadrant erosive reflux esophagitis, gastric and bulbar erosions, H. pylori negative  . Maloney dilation 11/02/2011    15F    Current Outpatient Prescriptions  Medication Sig Dispense Refill  . ALPRAZolam (XANAX) 0.5 MG tablet Take 0.5 mg by mouth 3 (three) times daily as needed. For anxiety       . diazepam (VALIUM) 10 MG tablet Take 10 mg by mouth every 6 (six) hours as needed. For spasms       . ibuprofen (ADVIL,MOTRIN) 200 MG tablet Take 400 mg by mouth every 6 (six) hours as needed. For pain       . lamoTRIgine (LAMICTAL) 200 MG tablet Take 200 mg by mouth daily.        Marland Kitchen levothyroxine (SYNTHROID, LEVOTHROID) 175 MCG tablet Take 175 mcg by mouth daily.        . pseudoephedrine (SINUS & ALLERGY 12 HOUR) 120 MG 12 hr tablet Take 120 mg by mouth every 12  (twelve) hours as needed. For allergies       . risperiDONE (RISPERDAL) 2 MG tablet Take 2 mg by mouth daily.       Marland Kitchen dexlansoprazole (DEXILANT) 60 MG capsule Take 1 capsule (60 mg total) by mouth daily.  31 capsule  11    Allergies as of 11/30/2011 - Review Complete 11/30/2011  Allergen Reaction Noted  . Penicillins Hives 10/11/2011  Review of Systems: Gen: Denies any fever, chills, sweats, anorexia, fatigue, weakness, malaise, weight loss, and sleep disorder CV: Denies chest pain, angina, palpitations, syncope, orthopnea, PND, peripheral edema, and claudication. Resp: Denies dyspnea at rest, dyspnea with exercise, cough, sputum, wheezing, coughing up blood, and pleurisy. GI: Denies vomiting blood, jaundice, and fecal incontinence.    Derm: Denies rash, itching, dry skin, hives, moles, warts, or unhealing ulcers.  Psych: Denies depression, anxiety, memory loss, suicidal ideation, hallucinations, paranoia, and confusion. Heme: Denies bruising, bleeding, and enlarged lymph nodes.  Physical Exam: BP 97/72  Pulse 77  Temp(Src) 97.2 F (36.2 C) (Temporal)  Ht 5\' 10"  (1.778 m)  Wt 215 lb 3.2 oz (97.614 kg)  BMI 30.88 kg/m2 General:   Alert,  Well-developed, well-nourished, pleasant and cooperative in NAD Eyes:  Sclera clear, no icterus.   Conjunctiva pink. Mouth:  No deformity  or lesions, oropharynx pink and moist. Neck:  Supple; no masses or thyromegaly. Heart:  Regular rate and rhythm; no murmurs, clicks, rubs,  or gallops. Abdomen:  Normal bowel sounds.  No bruits.  Soft, non-tender and non-distended without masses, hepatosplenomegaly or hernias noted.  No guarding or rebound tenderness.   Msk:  Symmetrical withounormt gross deformities.  Pulses:  Normal pulses noted. Extremities:  No clubbing or edema. Neurologic:  Alert and oriented x4;  grossly normal neurologically. Skin:  Intact without significant lesions or rashes.

## 2011-11-30 NOTE — Patient Instructions (Addendum)
Begin Dexilant 60mg  daily Followup 1 year with Dr. Jena Gauss or sooner if needed Colonoscopy September 2014  Diet for GERD or PUD Nutrition therapy can help ease the discomfort of gastroesophageal reflux disease (GERD) and peptic ulcer disease (PUD).  HOME CARE INSTRUCTIONS   Eat your meals slowly, in a relaxed setting.   Eat 5 to 6 small meals per day.   If a food causes distress, stop eating it for a period of time.  FOODS TO AVOID  Coffee, regular or decaffeinated.   Cola beverages, regular or low calorie.   Tea, regular or decaffeinated.   Pepper.   Cocoa.   High fat foods, including meats.   Butter, margarine, hydrogenated oil (trans fats).   Peppermint or spearmint (if you have GERD).   Fruits and vegetables if not tolerated.   Alcohol.   Nicotine (smoking or chewing). This is one of the most potent stimulants to acid production in the gastrointestinal tract.   Any food that seems to aggravate your condition.  If you have questions regarding your diet, ask your caregiver or a registered dietitian. TIPS  Lying flat may make symptoms worse. Keep the head of your bed raised 6 to 9 inches (15 to 23 cm) by using a foam wedge or blocks under the legs of the bed.   Do not lay down until 3 hours after eating a meal.   Daily physical activity may help reduce symptoms.  MAKE SURE YOU:   Understand these instructions.   Will watch your condition.   Will get help right away if you are not doing well or get worse.  Document Released: 12/03/2005 Document Revised: 08/15/2011 Document Reviewed: 04/18/2009 [facility] Patient Information 2012 [place], [place].

## 2011-12-19 NOTE — Progress Notes (Signed)
REVIEWED.  

## 2012-01-19 ENCOUNTER — Other Ambulatory Visit (HOSPITAL_COMMUNITY): Payer: Self-pay | Admitting: Physician Assistant

## 2012-01-21 ENCOUNTER — Encounter (HOSPITAL_COMMUNITY): Payer: Self-pay | Admitting: Physician Assistant

## 2012-01-25 ENCOUNTER — Other Ambulatory Visit (HOSPITAL_COMMUNITY): Payer: Self-pay | Admitting: Physician Assistant

## 2012-01-25 MED ORDER — ALPRAZOLAM 0.5 MG PO TABS: 0.5000 mg | ORAL_TABLET | Freq: Three times a day (TID) | ORAL | Status: DC | PRN

## 2012-02-18 ENCOUNTER — Encounter (HOSPITAL_COMMUNITY): Payer: Self-pay | Admitting: *Deleted

## 2012-03-19 ENCOUNTER — Ambulatory Visit (INDEPENDENT_AMBULATORY_CARE_PROVIDER_SITE_OTHER): Payer: 59 | Admitting: Gastroenterology

## 2012-03-19 ENCOUNTER — Encounter: Payer: Self-pay | Admitting: Gastroenterology

## 2012-03-19 VITALS — BP 98/61 | HR 63 | Temp 97.6°F | Ht 70.0 in | Wt 217.6 lb

## 2012-03-19 DIAGNOSIS — K2210 Ulcer of esophagus without bleeding: Secondary | ICD-10-CM

## 2012-03-19 DIAGNOSIS — R1314 Dysphagia, pharyngoesophageal phase: Secondary | ICD-10-CM

## 2012-03-19 DIAGNOSIS — R1310 Dysphagia, unspecified: Secondary | ICD-10-CM

## 2012-03-19 DIAGNOSIS — R1319 Other dysphagia: Secondary | ICD-10-CM

## 2012-03-19 HISTORY — DX: Other dysphagia: R13.19

## 2012-03-19 MED ORDER — DEXLANSOPRAZOLE 60 MG PO CPDR: 60.0000 mg | DELAYED_RELEASE_CAPSULE | Freq: Two times a day (BID) | ORAL | Status: DC

## 2012-03-19 NOTE — Progress Notes (Signed)
Faxed to PCP

## 2012-03-19 NOTE — Progress Notes (Signed)
Primary Care Physician: Cassell Smiles., MD, MD  Primary Gastroenterologist:  Roetta Sessions, MD   Chief Complaint  Patient presents with  . Heartburn    medication not working    HPI: Whitney Lopez is a 51 y.o. female here for c/o refractory heartburn and difficulty swallowing.   Dexilant 60mg  every night before to work (works third shift). Cut back on caffeine in coffee. No sodas. No fried or greasy foods. Feels like food sticking in lower chest. She tries to wash it down but then feels up with water and vomits. Feels like fire in throat. Stops at [facility] on way home from work for milkshake to cool it off. BM qod. No melena, brbpr. No abdominal pain.   Current Outpatient Prescriptions  Medication Sig Dispense Refill  . ALPRAZolam (XANAX) 0.5 MG tablet Take 1 tablet (0.5 mg total) by mouth 3 (three) times daily as needed. For anxiety  90 tablet  0  .      0  . diazepam (VALIUM) 10 MG tablet Take 10 mg by mouth every 6 (six) hours as needed. For spasms       . ibuprofen (ADVIL,MOTRIN) 200 MG tablet Take 400 mg by mouth every 6 (six) hours as needed. For pain       . lamoTRIgine (LAMICTAL) 200 MG tablet TAKE 1 TABLET BY MOUTH EVERY DAY  30 tablet  3  . levothyroxine (SYNTHROID, LEVOTHROID) 175 MCG tablet Take 175 mcg by mouth daily.        . pseudoephedrine (SINUS & ALLERGY 12 HOUR) 120 MG 12 hr tablet Take 120 mg by mouth every 12 (twelve) hours as needed. For allergies       . risperiDONE (RISPERDAL) 2 MG tablet Take 2 mg by mouth daily.       Marland Kitchen DISCONTD: dexlansoprazole (DEXILANT) 60 MG capsule Take 60 mg by mouth daily.      Marland Kitchen dexlansoprazole (DEXILANT) 60 MG capsule Take 1 capsule (60 mg total) by mouth daily.  31 capsule  11    Allergies as of 03/19/2012 - Review Complete 03/19/2012  Allergen Reaction Noted  . Penicillins Hives 10/11/2011    ROS:  General: Negative for anorexia, weight loss, fever, chills, fatigue, weakness. Intentional weight loss of 7 pounds since  10/12. ENT: Negative for hoarseness, difficulty swallowing , nasal congestion. CV: Negative for chest pain, angina, palpitations, dyspnea on exertion, peripheral edema.  Respiratory: Negative for dyspnea at rest, dyspnea on exertion, cough, sputum, wheezing.  GI: See history of present illness. GU:  Negative for dysuria, hematuria, urinary incontinence, urinary frequency, nocturnal urination.  Endo: Negative for unusual weight change.    Physical Examination:   BP 98/61  Pulse 63  Temp(Src) 97.6 F (36.4 C) (Temporal)  Ht 5\' 10"  (1.778 m)  Wt 217 lb 9.6 oz (98.703 kg)  BMI 31.22 kg/m2  General: Well-nourished, well-developed in no acute distress.  Eyes: No icterus. Mouth: Oropharyngeal mucosa moist and pink , no lesions erythema or exudate. Lungs: Clear to auscultation bilaterally.  Heart: Regular rate and rhythm, no murmurs rubs or gallops.  Abdomen: Bowel sounds are normal, nontender, nondistended, no hepatosplenomegaly or masses, no abdominal bruits or hernia , no rebound or guarding.   Extremities: No lower extremity edema. No clubbing or deformities. Neuro: Alert and oriented x 4   Skin: Warm and dry, no jaundice.   Psych: Alert and cooperative, normal mood and affect.

## 2012-03-19 NOTE — Assessment & Plan Note (Addendum)
No longer with heartburn but c/o regurgitation and sensation of food sticking. At times, she has to vomit the food back up if it will not wash down.  Increase Dexilant to BID. Continue antireflux measures. BPE.

## 2012-03-19 NOTE — Patient Instructions (Signed)
We have scheduled you for a barium pill esophagram to further evaluate your symptoms. We will call you with further instructions after we receive the results.  Please increase Dexilant to twice daily as discussed. Samples have been provided today. This will be short-term increase hopefully.

## 2012-03-20 ENCOUNTER — Ambulatory Visit (HOSPITAL_COMMUNITY)
Admission: RE | Admit: 2012-03-20 | Discharge: 2012-03-20 | Disposition: A | Discharge status: Home / Self Care | Payer: 59 | Source: Ambulatory Visit | Attending: Gastroenterology | Admitting: Gastroenterology

## 2012-03-20 DIAGNOSIS — R1310 Dysphagia, unspecified: Secondary | ICD-10-CM | POA: Insufficient documentation

## 2012-03-20 DIAGNOSIS — K2210 Ulcer of esophagus without bleeding: Secondary | ICD-10-CM

## 2012-03-20 NOTE — Progress Notes (Signed)
Quick Note:  Please let pt know, BPE normal. Good esophageal contractions. No stricture noted.  Let's see if increased PPI to BID helps as discussed yesterday in office. Call in two weeks with PR. ______

## 2012-04-02 ENCOUNTER — Ambulatory Visit (HOSPITAL_COMMUNITY): Payer: 59 | Admitting: Physician Assistant

## 2012-04-15 ENCOUNTER — Other Ambulatory Visit (HOSPITAL_COMMUNITY): Payer: Self-pay | Admitting: Family Medicine

## 2012-04-15 DIAGNOSIS — Z139 Encounter for screening, unspecified: Secondary | ICD-10-CM

## 2012-04-15 NOTE — Progress Notes (Signed)
BPE NL   REVIEWED.

## 2012-04-22 ENCOUNTER — Ambulatory Visit (HOSPITAL_COMMUNITY)
Admission: RE | Admit: 2012-04-22 | Discharge: 2012-04-22 | Disposition: A | Discharge status: Home / Self Care | Payer: 59 | Source: Ambulatory Visit | Attending: Family Medicine | Admitting: Family Medicine

## 2012-04-22 DIAGNOSIS — Z1231 Encounter for screening mammogram for malignant neoplasm of breast: Secondary | ICD-10-CM | POA: Insufficient documentation

## 2012-04-22 DIAGNOSIS — Z139 Encounter for screening, unspecified: Secondary | ICD-10-CM

## 2012-05-18 ENCOUNTER — Other Ambulatory Visit (HOSPITAL_COMMUNITY): Payer: Self-pay | Admitting: Physician Assistant

## 2012-05-19 ENCOUNTER — Ambulatory Visit (HOSPITAL_COMMUNITY): Payer: 59 | Admitting: Physician Assistant

## 2012-07-31 ENCOUNTER — Ambulatory Visit (HOSPITAL_COMMUNITY): Payer: 59 | Admitting: Physician Assistant

## 2012-09-12 ENCOUNTER — Other Ambulatory Visit (HOSPITAL_COMMUNITY): Payer: Self-pay | Admitting: Physician Assistant

## 2012-09-16 ENCOUNTER — Other Ambulatory Visit (HOSPITAL_COMMUNITY): Payer: Self-pay | Admitting: *Deleted

## 2012-09-16 MED ORDER — RISPERIDONE 2 MG PO TABS: 2.0000 mg | ORAL_TABLET | Freq: Every day | ORAL | Status: DC

## 2012-09-16 MED ORDER — LAMOTRIGINE 200 MG PO TABS: 200.0000 mg | ORAL_TABLET | Freq: Every day | ORAL | Status: DC

## 2012-10-09 ENCOUNTER — Ambulatory Visit (INDEPENDENT_AMBULATORY_CARE_PROVIDER_SITE_OTHER): Payer: 59 | Admitting: Physician Assistant

## 2012-10-09 DIAGNOSIS — F319 Bipolar disorder, unspecified: Secondary | ICD-10-CM | POA: Insufficient documentation

## 2012-10-09 HISTORY — DX: Bipolar disorder, unspecified: F31.9

## 2012-10-09 MED ORDER — RISPERIDONE 2 MG PO TABS: 2.0000 mg | ORAL_TABLET | Freq: Every day | ORAL | Status: DC

## 2012-10-09 MED ORDER — LAMOTRIGINE 200 MG PO TABS: 200.0000 mg | ORAL_TABLET | Freq: Every day | ORAL | Status: DC

## 2012-10-09 MED ORDER — ALPRAZOLAM 0.5 MG PO TABS: 0.5000 mg | ORAL_TABLET | Freq: Three times a day (TID) | ORAL | Status: DC | PRN

## 2012-10-09 NOTE — Progress Notes (Signed)
   [facility] Follow-up Outpatient Visit  HAYLA PRIGGE Jun 24, 1961  Date: 10/09/2012   Subjective: Whitney Lopez presents today to followup on her treatment for bipolar disorder. She reports that she has gotten married and is very happy. She does endorse some increased irritability, but feels that that may because she is perimenopausal. She denies any decreased need for sleep or increased in impulsivity. She denies any depression. Her appetite is good. She denies any suicidal or homicidal ideation. She denies any auditory or visual hallucinations.  There were no vitals filed for this visit.  Mental Status Examination  Appearance: Casual Alert: Yes Attention: good  Cooperative: Yes Eye Contact: Good Speech: Clear and coherent Psychomotor Activity: Normal Memory/Concentration: Intact Oriented: person, place, time/date and situation Mood: Euthymic Affect: Appropriate Thought Processes and Associations: Linear Fund of Knowledge: Good Thought Content: Normal Insight: Good Judgement: Good  Diagnosis: Bipolar disorder not otherwise specified  Treatment Plan: We will continue her Lamictal 200 mg daily, Risperdal 2 mg at bedtime, and alprazolam 0.5 mg 3 times daily when necessary for anxiety. She will return for followup in 6 months. She has been instructed to be vigilant for any signs of mania including a decreased need for sleep or increased in impulsivity behavior.  Kaulana Brindle, PA-C

## 2012-10-21 ENCOUNTER — Other Ambulatory Visit (HOSPITAL_COMMUNITY): Payer: Self-pay | Admitting: Physician Assistant

## 2012-10-21 DIAGNOSIS — F319 Bipolar disorder, unspecified: Secondary | ICD-10-CM

## 2012-10-23 ENCOUNTER — Telehealth (HOSPITAL_COMMUNITY): Payer: Self-pay

## 2012-10-23 NOTE — Telephone Encounter (Signed)
1:28PM 10/23/12 CALLED PT TO INFORMED THAT FMLA PAPERS WERE READY FOR PICK-UP./SH

## 2012-10-27 ENCOUNTER — Telehealth (HOSPITAL_COMMUNITY): Payer: Self-pay

## 2012-10-27 NOTE — Telephone Encounter (Signed)
8:25AM 10/27/12 PT CAME BY AND PICK-IP FMLA PAPERWORK./SH

## 2012-11-11 ENCOUNTER — Encounter: Payer: Self-pay | Admitting: *Deleted

## 2012-11-21 ENCOUNTER — Other Ambulatory Visit (HOSPITAL_COMMUNITY): Payer: Self-pay | Admitting: Physician Assistant

## 2012-12-15 ENCOUNTER — Other Ambulatory Visit: Payer: Self-pay

## 2012-12-15 MED ORDER — DEXLANSOPRAZOLE 60 MG PO CPDR: 60.0000 mg | DELAYED_RELEASE_CAPSULE | Freq: Two times a day (BID) | ORAL | Status: DC

## 2012-12-15 NOTE — Telephone Encounter (Signed)
Dawn can you please make her an appointment. 

## 2012-12-15 NOTE — Telephone Encounter (Signed)
Needs OV in 4-6 weeks re: GERD, discuss BID Dexilant Thanks

## 2012-12-15 NOTE — Telephone Encounter (Signed)
appt made for 2-3 @ 3pm

## 2013-01-16 ENCOUNTER — Encounter: Payer: Self-pay | Admitting: Internal Medicine

## 2013-01-19 ENCOUNTER — Encounter: Payer: Self-pay | Admitting: Gastroenterology

## 2013-01-19 ENCOUNTER — Ambulatory Visit (INDEPENDENT_AMBULATORY_CARE_PROVIDER_SITE_OTHER): Payer: 59 | Admitting: Gastroenterology

## 2013-01-19 VITALS — BP 106/68 | HR 85 | Temp 97.6°F | Ht 70.0 in | Wt 211.0 lb

## 2013-01-19 DIAGNOSIS — K2210 Ulcer of esophagus without bleeding: Secondary | ICD-10-CM

## 2013-01-19 DIAGNOSIS — R140 Abdominal distension (gaseous): Secondary | ICD-10-CM

## 2013-01-19 DIAGNOSIS — R142 Eructation: Secondary | ICD-10-CM

## 2013-01-19 DIAGNOSIS — R05 Cough: Secondary | ICD-10-CM

## 2013-01-19 NOTE — Progress Notes (Signed)
Referring Provider: Elfredia Nevins, MD Primary Care Physician:  Cassell Smiles., MD Primary Gastroenterologist: Dr. Jena Gauss   Chief Complaint  Patient presents with  . Follow-up    HPI:   52 year old female presenting today with hx of refractory heartburn and dysphagia. She underwent an EGD in Nov 2012 with erosive reflux esophagitis, s/p empiric dilation. UGI/BPE performed April 2013 with normal esophageal distension, no mass, no delay of barium tablet, normal esophageal motility. PPI was recommended to be increased to BID at that time.  However, she notes taking Dexilant once per day. Has been off of it for 2 weeks because had to get it refilled. She states she never took it BID. States she has been watching what she is eating, no problems. No dysphagia, no problems with reflux. No prior colonoscopy. Due this year, September turns 52. States it feels like food just sits in her stomach. Feels bloated. Feels like she is on her period, makes her crazy. States if she coughs too much, starts to throw up. Has had to cough each morning since being sick in November. Got sick right before Thanksgiving with upper respiratory issues. Vomiting every other day after coughing spells. Has decreased smoking, thinking this may help, wondering if it was "smoker's cough".  Feels like some phlegm in the right side. No constipation or diarrhea, no rectal bleeding. 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 States really dusty. She is also concerned about a small bruise above her umbilicus. States she does not know where it came from, denies injury. No pain.   Past Medical History  Diagnosis Date  . Bipolar disorder   . Lower extremity edema   . Hypothyroidism   . Headache   . Depression   . Anxiety   . GERD (gastroesophageal reflux disease)     Erosive reflux esophagitis    Past Surgical History  Procedure Date  . S/p hysterectomy   . Clavicle repair   . Cholecystectomy   .  Esophagogastroduodenoscopy 11/02/2011    RMR: Four-quadrant erosive reflux esophagitis, gastric and bulbar erosions, H. pylori negative  . Maloney dilation 11/02/2011    4F    Current Outpatient Prescriptions  Medication Sig Dispense Refill  . ALPRAZolam (XANAX) 0.5 MG tablet Take 1 tablet (0.5 mg total) by mouth 3 (three) times daily as needed. For anxiety  90 tablet  0  . dexlansoprazole (DEXILANT) 60 MG capsule Take 1 capsule (60 mg total) by mouth 2 (two) times daily.  60 capsule  2  . ibuprofen (ADVIL,MOTRIN) 200 MG tablet Take 400 mg by mouth every 6 (six) hours as needed. For pain       . lamoTRIgine (LAMICTAL) 200 MG tablet TAKE 1 TABLET BY MOUTH EVERY DAY  30 tablet  5  . levothyroxine (SYNTHROID, LEVOTHROID) 175 MCG tablet Take 175 mcg by mouth daily.        . pseudoephedrine (SINUS & ALLERGY 12 HOUR) 120 MG 12 hr tablet Take 120 mg by mouth every 12 (twelve) hours as needed. For allergies       . risperiDONE (RISPERDAL) 2 MG tablet TAKE 1 TABLET BY MOUTH AT BEDTIME  30 tablet  3  . dexlansoprazole (DEXILANT) 60 MG capsule Take 1 capsule (60 mg total) by mouth daily.  31 capsule  11    Allergies as of 01/19/2013 - Review Complete 01/19/2013  Allergen Reaction Noted  . Penicillins Hives 10/11/2011    Family History  Problem Relation Age of Onset  . Colon  cancer Neg Hx   . Ulcers Mother     History   Social History  . Marital Status: Widowed    Spouse Name: N/A    Number of Children: 0  . Years of Education: N/A   Occupational History  .  [employer]   Social History Main Topics  . Smoking status: Current Every Day Smoker -- 1.0 packs/day for 34 years    Types: Cigarettes  . Smokeless tobacco: None  . Alcohol Use: No  . Drug Use: No  . Sexually Active: None   Other Topics Concern  . None   Social History Narrative  . None    Review of Systems: Negative unless mentioned above  Physical Exam: BP 106/68  Pulse 85  Temp 97.6 F (36.4 C) (Oral)   Ht 5\' 10"  (1.778 m)  Wt 211 lb (95.709 kg)  BMI 30.28 kg/m2 General:   Alert and oriented. No distress noted. Pleasant and cooperative.  Head:  Normocephalic and atraumatic. Eyes:  Conjuctiva clear without scleral icterus. Mouth:  Oral mucosa pink and moist. Good dentition. No lesions. Neck:  Supple, without mass or thyromegaly. Lungs: scattered rhonchi, diminished bases Heart:  S1, S2 present without murmurs, rubs, or gallops. Regular rate and rhythm. Abdomen:  +BS, soft, non-tender and non-distended. No rebound or guarding. No HSM or masses noted. Small, nickel-sized resolving bruise, no edema or concerning signs. Noted above umbilicus.  Msk:  Symmetrical without gross deformities. Normal posture.  Neurologic:  Alert and  oriented x4;  grossly normal neurologically. Skin:  Intact without significant lesions or rashes. Psych:  Alert and cooperative. Normal mood and affect.

## 2013-01-19 NOTE — Patient Instructions (Addendum)
You may decrease Dexilant to every other day.   I have set you up for an xray of your chest and a CT scan of your belly. We will be in touch with the results once available.  Return in September/October to schedule a colonoscopy.  Please call with any concerns in the meantime!

## 2013-01-20 DIAGNOSIS — R140 Abdominal distension (gaseous): Secondary | ICD-10-CM | POA: Insufficient documentation

## 2013-01-20 DIAGNOSIS — R05 Cough: Secondary | ICD-10-CM | POA: Insufficient documentation

## 2013-01-20 DIAGNOSIS — R053 Chronic cough: Secondary | ICD-10-CM

## 2013-01-20 HISTORY — DX: Abdominal distension (gaseous): R14.0

## 2013-01-20 HISTORY — DX: Chronic cough: R05.3

## 2013-01-20 NOTE — Assessment & Plan Note (Signed)
52 year old female with hx of refractory heartburn and dysphagia, last EGD Nov 2012 with empiric dilation. Doing well with Dexilant daily AND significant lifestyle changes with dietary modification. Dysphagia resolved, reflux much improved. May take Dexilant every other day. Return in Sept/Oct to set up initial screening colonoscopy.

## 2013-01-20 NOTE — Assessment & Plan Note (Signed)
Since Nov. Multiple coughing spells resulting in vomiting. 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 Notes "dusty". Proceed with CXR. Applauded on smoking reduction efforts.

## 2013-01-20 NOTE — Progress Notes (Signed)
Faxed to PCP

## 2013-01-20 NOTE — Assessment & Plan Note (Signed)
Pt notes vague abdominal bloating, concerned and states "something is not right". No changes in bowel habits, constipation, diarrhea, rectal bleeding. Initial screening colonoscopy due Sept/October this year. She is quite concerned that her ovaries remain. Notes significant difficulties in losing weight despite following weight-watchers recommendations (she is a life-time member). No real exercise regimen. Likely her lack of weight loss may be multifactorial; she has borderline diabetes, takes synthroid, and is not exercising routinely. I recommended further evaluation with endocrinologist and her PCP. She agrees to this. She is still concerned about worsening abdominal bloating, and I do not see a problem with abdominal imaging in the near future. She was quite thankful for this. I doubt dealing with occult malignancy, but we will proceed with CT abd/pelvis in near future.

## 2013-01-21 ENCOUNTER — Ambulatory Visit (HOSPITAL_COMMUNITY)
Admission: RE | Admit: 2013-01-21 | Discharge: 2013-01-21 | Disposition: A | Discharge status: Home / Self Care | Payer: 59 | Source: Ambulatory Visit | Attending: Gastroenterology | Admitting: Gastroenterology

## 2013-01-21 ENCOUNTER — Other Ambulatory Visit: Payer: Self-pay | Admitting: Gastroenterology

## 2013-01-21 DIAGNOSIS — R140 Abdominal distension (gaseous): Secondary | ICD-10-CM

## 2013-01-21 DIAGNOSIS — R9389 Abnormal findings on diagnostic imaging of other specified body structures: Secondary | ICD-10-CM | POA: Insufficient documentation

## 2013-01-21 DIAGNOSIS — R05 Cough: Secondary | ICD-10-CM

## 2013-01-21 DIAGNOSIS — R109 Unspecified abdominal pain: Secondary | ICD-10-CM | POA: Insufficient documentation

## 2013-01-21 DIAGNOSIS — R110 Nausea: Secondary | ICD-10-CM | POA: Insufficient documentation

## 2013-01-21 MED ORDER — IOHEXOL 300 MG/ML  SOLN
100.0000 mL | Freq: Once | INTRAMUSCULAR | Status: AC | PRN
  Administered 2013-01-21: 100 mL via INTRAVENOUS

## 2013-01-27 NOTE — Progress Notes (Signed)
Quick Note:  Please let pt know there are no concerning findings on her CT. 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 I'd like this sent to her PCP for their records. I don't think she needs to see urology, but there is mention of mild dilation of the upper pole. This shouldn't be causing her issues. Mildly prominent inguinal lymph nodes, non-specific. Let's get an ifobt.  If positive, we can proceed with a colonoscopy. ______

## 2013-01-27 NOTE — Progress Notes (Signed)
Quick Note:  No signs of edema, pleural effusion, etc on chest xray. Needs to quit smoking.  There is a small nodule seen in left lower lobe, which is likely benign. However, let's put her on the recall list for CT chest in 3 months.  ______

## 2013-02-03 NOTE — Progress Notes (Signed)
Quick Note:  Per AS-I spoke with patient regarding the results.   Dawn, can you send this (along with result notes) to her PCP?    [name], she will be coming by to pick up an ifobt.   ______

## 2013-02-03 NOTE — Progress Notes (Signed)
Results faxed to PCP 

## 2013-02-03 NOTE — Progress Notes (Signed)
Quick Note:  ifobt at front desk for pt to pick up ______

## 2013-02-03 NOTE — Progress Notes (Signed)
Quick Note:  I spoke with patient regarding the results. Whitney Lopez, can you send this (along with result notes) to her PCP?  [name], she will be coming by to pick up an ifobt. ______

## 2013-03-12 ENCOUNTER — Ambulatory Visit (INDEPENDENT_AMBULATORY_CARE_PROVIDER_SITE_OTHER): Payer: 59 | Admitting: Gastroenterology

## 2013-03-12 DIAGNOSIS — R143 Flatulence: Secondary | ICD-10-CM

## 2013-03-12 DIAGNOSIS — R141 Gas pain: Secondary | ICD-10-CM

## 2013-03-17 NOTE — Progress Notes (Signed)
Quick Note:  Negative ifobt. How is pt's bloating? ______

## 2013-03-18 NOTE — Progress Notes (Signed)
Quick Note:  Pt is aware, she stated the bloating was about the same. She also said she has changed her diet to include more protein because of her thyroid MD advised her to do that. She also is eating a lot of whole grains. She is considering switching to a gluten free diet and asked if I could send her some info on a gluten free diet. Info in the mail to the pt. ______

## 2013-03-19 NOTE — Progress Notes (Signed)
Quick Note:  Ok. If no improvement, have her make an appt to see [place] sooner than in the fall. ______

## 2013-04-09 ENCOUNTER — Encounter (HOSPITAL_COMMUNITY): Payer: Self-pay | Admitting: Physician Assistant

## 2013-04-09 ENCOUNTER — Other Ambulatory Visit: Payer: Self-pay

## 2013-04-09 ENCOUNTER — Ambulatory Visit (HOSPITAL_COMMUNITY): Payer: Self-pay | Admitting: Physician Assistant

## 2013-04-09 ENCOUNTER — Ambulatory Visit (INDEPENDENT_AMBULATORY_CARE_PROVIDER_SITE_OTHER): Payer: 59 | Admitting: Physician Assistant

## 2013-04-09 DIAGNOSIS — F319 Bipolar disorder, unspecified: Secondary | ICD-10-CM

## 2013-04-09 MED ORDER — LAMOTRIGINE 200 MG PO TABS: 200.0000 mg | ORAL_TABLET | Freq: Every day | ORAL | Status: DC

## 2013-04-09 MED ORDER — DEXLANSOPRAZOLE 60 MG PO CPDR: 60.0000 mg | DELAYED_RELEASE_CAPSULE | Freq: Every day | ORAL | Status: DC

## 2013-04-09 MED ORDER — RISPERIDONE 2 MG PO TABS: 2.0000 mg | ORAL_TABLET | Freq: Every day | ORAL | Status: DC

## 2013-04-09 NOTE — Progress Notes (Signed)
   [facility]al Health Follow-up Outpatient Visit  Whitney Lopez 10-Feb-1961  Date: 04/09/2013   Subjective: [name] presents today to followup on her treatment for bipolar disorder. She reports that everything is going well. Her mood has been stable. She is sleeping well. She continues to be happily married. She has been gaining weight, and they have been going through many tests to figure out why that is. They now feel that she may be gluten intolerant, and she is on a gluten-free diet. She denies any suicidal or homicidal ideation. She denies any auditory or visual hallucinations.  There were no vitals filed for this visit.  Mental Status Examination  Appearance: Casual Alert: Yes Attention: good  Cooperative: Yes Eye Contact: Good Speech: Clear and coherent Psychomotor Activity: Normal Memory/Concentration: Intact Oriented: person, place, time/date and situation Mood: Euthymic Affect: Appropriate Thought Processes and Associations: Linear Fund of Knowledge: Good Thought Content: Normal Insight: Good Judgement: Good  Diagnosis: Bipolar disorder NOS  Treatment Plan: We will continue her Lamictal 200 mg daily, Risperdal 2 mg at bedtime, and Xanax 0.5 mg 3 times daily as needed. She requests that we order 90 day prescriptions for her Lamictal and Risperdal. She will return in 6 months for followup.  Syair Fricker, PA-C

## 2013-04-13 ENCOUNTER — Telehealth: Payer: Self-pay

## 2013-04-13 NOTE — Telephone Encounter (Signed)
Autumn from [facility] called to check on the status of the request for the Dexilant. I told her it was sent by Gerrit Halls, NP on 04/09/2013 3 30 with 3 refills.

## 2013-04-19 ENCOUNTER — Other Ambulatory Visit (HOSPITAL_COMMUNITY): Payer: Self-pay | Admitting: Physician Assistant

## 2013-05-12 ENCOUNTER — Encounter: Payer: Self-pay | Admitting: Gastroenterology

## 2013-05-12 ENCOUNTER — Telehealth: Payer: Self-pay | Admitting: General Practice

## 2013-05-12 ENCOUNTER — Encounter: Payer: Self-pay | Admitting: General Practice

## 2013-05-12 NOTE — Telephone Encounter (Signed)
PATIENT NEEDS 3 MONTH REPEAT CHEST CT

## 2013-05-12 NOTE — Telephone Encounter (Signed)
Letter has been mailed to the Patient

## 2013-08-20 ENCOUNTER — Other Ambulatory Visit: Payer: Self-pay | Admitting: Urgent Care

## 2013-10-14 ENCOUNTER — Encounter (INDEPENDENT_AMBULATORY_CARE_PROVIDER_SITE_OTHER): Payer: Self-pay

## 2013-10-14 ENCOUNTER — Ambulatory Visit (INDEPENDENT_AMBULATORY_CARE_PROVIDER_SITE_OTHER): Payer: 59 | Admitting: Physician Assistant

## 2013-10-14 VITALS — BP 104/71 | HR 59 | Ht 69.0 in | Wt 208.8 lb

## 2013-10-14 DIAGNOSIS — F319 Bipolar disorder, unspecified: Secondary | ICD-10-CM

## 2013-10-14 MED ORDER — RISPERIDONE 2 MG PO TABS: 2.0000 mg | ORAL_TABLET | Freq: Every day | ORAL | Status: DC

## 2013-10-14 MED ORDER — LAMOTRIGINE 200 MG PO TABS: 200.0000 mg | ORAL_TABLET | Freq: Every day | ORAL | Status: DC

## 2013-10-19 ENCOUNTER — Other Ambulatory Visit (HOSPITAL_COMMUNITY): Payer: Self-pay | Admitting: Physician Assistant

## 2013-11-10 ENCOUNTER — Ambulatory Visit (INDEPENDENT_AMBULATORY_CARE_PROVIDER_SITE_OTHER): Payer: 59 | Admitting: Psychiatry

## 2013-11-10 ENCOUNTER — Encounter (HOSPITAL_COMMUNITY): Payer: Self-pay | Admitting: Psychiatry

## 2013-11-10 VITALS — BP 110/60 | HR 73 | Ht 69.0 in | Wt 206.8 lb

## 2013-11-10 DIAGNOSIS — F319 Bipolar disorder, unspecified: Secondary | ICD-10-CM

## 2013-11-10 MED ORDER — LAMOTRIGINE 200 MG PO TABS: 200.0000 mg | ORAL_TABLET | Freq: Every day | ORAL | Status: DC

## 2013-11-10 MED ORDER — RISPERIDONE 2 MG PO TABS: 2.0000 mg | ORAL_TABLET | Freq: Every day | ORAL | Status: DC

## 2013-11-10 NOTE — Progress Notes (Signed)
[facility] [address] Progress Note  Whitney Lopez [id number] 52 y.o.  11/10/2013 11:06 AM  Chief Complaint:  I need my medication.  History of Present Illness:  Patient is a 52 year old Caucasian employed married female who has been seen in this office since 2006 for the management of bipolar disorder.  Patient was seeing a physician assistant Jorje Guild who has left the practice.  This is the first time I am seeing this patient.  Patient is taking Lamictal and Risperdal without any side effects.  She is no longer taking Xanax.  She sleeping better.  She denies any irritability anger or any mood swing.  Her husband is very supportive.  She is concerned about her company who recently bought out and she is wondering about her future.  She denies any paranoia, suicidal thoughts or any homicidal thoughts.  She likes her current medication.  She denies any tremors or shakes.  She has no rash or itching.  She denies any suicidal thoughts or homicidal thoughts.  She is seeing therapist Glenice Bow monthly for counseling.  She is requesting her FMLA to be completed so she can continue to come for followup appointment and use a time off for flareups .    Suicidal Ideation: No Plan Formed: No Patient has means to carry out plan: No  Homicidal Ideation: No Plan Formed: No Patient has means to carry out plan: No  Medical History; Patient has history of erosive esophagitis, hypothyroidism.  Her primary care physician is Dr. Phillips Odor in [place].  Psychosocial History; She lives with her husband.  She has no children.  This is her second marriage.  Her father deceased.  Past Psychiatric History/Hospitalization(s) Patient is taking psychotropic medications since age 48.  She has history of mood swings anger and bipolar disorder.  [facility]  She has taken overdose on Valium in 2008.  In the past she had tried lithium and Prozac.  She has done  intensive outpatient program in the past. Anxiety: No Bipolar Disorder: Yes Depression: Yes Mania: Yes Psychosis: No Schizophrenia: No Personality Disorder: No Hospitalization for psychiatric illness: Yes History of Electroconvulsive Shock Therapy: No Prior Suicide Attempts: Yes   Review of Systems: Psychiatric: Agitation: No Hallucination: No Depressed Mood: No Insomnia: Yes Hypersomnia: No Altered Concentration: No Feels Worthless: No Grandiose Ideas: No Belief In Special Powers: No New/Increased Substance Abuse: No Compulsions: No  Neurologic: Headache: No Seizure: No Paresthesias: No    Outpatient Encounter Prescriptions as of 11/10/2013  Medication Sig  . dexlansoprazole (DEXILANT) 60 MG capsule Take 1 capsule (60 mg total) by mouth daily.  Marland Kitchen ibuprofen (ADVIL,MOTRIN) 200 MG tablet Take 400 mg by mouth every 6 (six) hours as needed. For pain   . lamoTRIgine (LAMICTAL) 200 MG tablet Take 1 tablet (200 mg total) by mouth daily.  Marland Kitchen levothyroxine (SYNTHROID, LEVOTHROID) 175 MCG tablet Take 175 mcg by mouth daily.    . pseudoephedrine (SINUS & ALLERGY 12 HOUR) 120 MG 12 hr tablet Take 120 mg by mouth every 12 (twelve) hours as needed. For allergies   . risperiDONE (RISPERDAL) 2 MG tablet Take 1 tablet (2 mg total) by mouth at bedtime.  . [DISCONTINUED] lamoTRIgine (LAMICTAL) 200 MG tablet Take 1 tablet (200 mg total) by mouth daily.  . [DISCONTINUED] risperiDONE (RISPERDAL) 2 MG tablet Take 1 tablet (2 mg total) by mouth at bedtime.  Marland Kitchen DEXILANT 60 MG capsule TAKE ONE CAPSULE BY MOUTH EVERY DAY  . [  DISCONTINUED] ALPRAZolam (XANAX) 0.5 MG tablet Take 1 tablet (0.5 mg total) by mouth 3 (three) times daily as needed. For anxiety  . [DISCONTINUED] dexlansoprazole (DEXILANT) 60 MG capsule Take 1 capsule (60 mg total) by mouth daily.    No results found for this or any previous visit (from the past 2160 hour(s)).    Physical Exam: Constitutional:  BP 110/60  Pulse 73   Ht 5\' 9"  (1.753 m)  Wt 206 lb 12.8 oz (93.804 kg)  BMI 30.53 kg/m2  Musculoskeletal: Strength & Muscle Tone: within normal limits Gait & Station: normal Patient leans: N/A  Mental Status Examination;  Patient is casually dressed and fairly groomed.  She maintains good eye contact.  Her speech is clear and coherent.  Her thought process is logical and goal-directed.  She denies any auditory or visual hallucination.  She denies any active or any passive suicidal thoughts.  Her attention and concentration is fair.  There were no tremors or shakes.  She describes her mood as good affect is mood appropriate.  She is alert and oriented x3.  Her insight judgment and impulse control is okay.    Medical Decision Making (Choose Three): Established Problem, Stable/Improving (1), Review of Psycho-Social Stressors (1), Decision to obtain old records (1), Review and summation of old records (2), Review of Last Therapy Session (1), Review of Medication Regimen & Side Effects (2) and Review of New Medication or Change in Dosage (2)  Assessment: Axis I: Bipolar disorder  Axis II: Deferred  Axis III:  Past Medical History  Diagnosis Date  . Bipolar disorder   . Lower extremity edema   . Hypothyroidism   . Headache(784.0)   . Depression   . Anxiety   . GERD (gastroesophageal reflux disease)     Erosive reflux esophagitis    Axis IV: Mild   Plan:  We'll patient is doing better on her current psychotropic medication.  I will continue Lamictal 200 mg daily and Risperdal 2 milligrams at bedtime.  Patient at this time does not have any side effects.  We will discontinue Xanax as patient has not taken in recent months.  She wants to complete out FMLA paper so she can continue to come for followup appointments.  We will consider blood work on her next visit.  Recommend to call us back if she is any question of a concern.  Followup in 3 months.Time spent 25 minutes.  More than 50% of the time spent in  psychoeducation, counseling and coordination of care.  Discuss safety plan that anytime having active suicidal thoughts or homicidal thoughts then patient need to call 911 or go to the local emergency room.    Ritaj Dullea T., MD 11/10/2013

## 2014-01-15 ENCOUNTER — Other Ambulatory Visit: Payer: Self-pay | Admitting: Urgent Care

## 2014-01-18 ENCOUNTER — Ambulatory Visit (HOSPITAL_COMMUNITY): Payer: Self-pay | Admitting: Psychiatry

## 2014-03-08 ENCOUNTER — Other Ambulatory Visit (HOSPITAL_COMMUNITY): Payer: Self-pay | Admitting: Psychiatry

## 2014-03-08 ENCOUNTER — Telehealth (HOSPITAL_COMMUNITY): Payer: Self-pay

## 2014-03-08 NOTE — Telephone Encounter (Signed)
03/08/14 9:28am Patient walk into the office stating that she needed a letter for her job because her job is going to fired because she had a panic attack, the patient was informed that her last visit was Nov 2014 and she didn't keep her f/u appt.  Spoke with Dr. Adele Schilder - stated that she would have to get her pcp to write the note for her - explained to patient - patient stated " ok no problem because they had given me a note to be out of work before"   Patient left work/sh

## 2014-05-14 ENCOUNTER — Other Ambulatory Visit: Payer: Self-pay | Admitting: Gastroenterology

## 2014-06-30 ENCOUNTER — Encounter (HOSPITAL_COMMUNITY): Payer: Self-pay | Admitting: Psychiatry

## 2014-06-30 ENCOUNTER — Ambulatory Visit (INDEPENDENT_AMBULATORY_CARE_PROVIDER_SITE_OTHER): Payer: 59 | Admitting: Psychiatry

## 2014-06-30 VITALS — BP 116/69 | HR 81 | Ht 69.0 in | Wt 206.0 lb

## 2014-06-30 DIAGNOSIS — F319 Bipolar disorder, unspecified: Secondary | ICD-10-CM

## 2014-06-30 MED ORDER — LAMOTRIGINE 200 MG PO TABS: 200.0000 mg | ORAL_TABLET | Freq: Every day | ORAL | Status: DC

## 2014-06-30 MED ORDER — RISPERIDONE 2 MG PO TABS: 2.0000 mg | ORAL_TABLET | Freq: Every day | ORAL | Status: DC

## 2014-06-30 NOTE — Progress Notes (Signed)
[facility] Progress Note  Whitney Lopez [id number] 53 y.o.  06/30/2014 5:20 PM  Chief Complaint:  I had 2 panic attacks in the past few months.  History of Present Illness:  Whitney Lopez came for her appointment.  She was last seen in November 2014 .  She missed appointment in March.  She is complaining of panic attack which happens every 2-3 months.  Overall she is taking her medication and denies any side effects.  She has not taken any medication for panic attack.  She described the panic attacks last for 45 minutes and then he gradually resolved.  She is sleeping okay.  She has some stress at work.  She does not take any benzodiazepine.  She is seeing Dr. Chalmers Cater for hypothyroidism she likes her current psychotropic medication. Patient is taking Lamictal and Risperdal.  She denies any paranoia, hallucinations, agitation or any irritability.  She is not drinking alcohol or using any illegal substances.  Her vitals are stable.  Her weight is unchanged from the past.  She lives with her husband.  Suical Ideation: No Plan Formed: No Patient has means to carry out plan: No  Homicidal Ideation: No Plan Formed: No Patient has means to carry out plan: No  Medical History; Patient has history of erosive esophagitis, hypothyroidism.  Her primary care physician is Dr. Hilma Favors in [place].  Past Psychiatric History/Hospitalization(s) Patient is taking psychotropic medications since age 89.  She has history of mood swings anger and bipolar disorder.  She is admitted to [facility] multiple times.  She has taken overdose on Valium in 2008.  In the past she had tried lithium and Prozac.  She has done intensive outpatient program in the past. Anxiety: No Bipolar Disorder: Yes Depression: Yes Mania: Yes Psychosis: No Schizophrenia: No Personality Disorder: No Hospitalization for psychiatric illness: Yes History of Electroconvulsive Shock Therapy: No Prior Suicide Attempts:  Yes   Review of Systems: Psychiatric: Agitation: No Hallucination: No Depressed Mood: No Insomnia: Yes Hypersomnia: No Altered Concentration: No Feels Worthless: No Grandiose Ideas: No Belief In Special Powers: No New/Increased Substance Abuse: No Compulsions: No  Neurologic: Headache: No Seizure: No Paresthesias: No    Outpatient Encounter Prescriptions as of 06/30/2014  Medication Sig  . dexlansoprazole (DEXILANT) 60 MG capsule Take 1 capsule (60 mg total) by mouth daily.  Marland Kitchen lamoTRIgine (LAMICTAL) 200 MG tablet Take 1 tablet (200 mg total) by mouth daily.  Marland Kitchen levothyroxine (SYNTHROID, LEVOTHROID) 175 MCG tablet Take 175 mcg by mouth daily.    . risperiDONE (RISPERDAL) 2 MG tablet Take 1 tablet (2 mg total) by mouth at bedtime.  . [DISCONTINUED] lamoTRIgine (LAMICTAL) 200 MG tablet Take 1 tablet (200 mg total) by mouth daily.  . [DISCONTINUED] risperiDONE (RISPERDAL) 2 MG tablet Take 1 tablet (2 mg total) by mouth at bedtime.  . [DISCONTINUED] DEXILANT 60 MG capsule TAKE ONE CAPSULE BY MOUTH EVERY DAY  . [DISCONTINUED] ibuprofen (ADVIL,MOTRIN) 200 MG tablet Take 400 mg by mouth every 6 (six) hours as needed. For pain   . [DISCONTINUED] pseudoephedrine (SINUS & ALLERGY 12 HOUR) 120 MG 12 hr tablet Take 120 mg by mouth every 12 (twelve) hours as needed. For allergies     No results found for this or any previous visit (from the past 2160 hour(s)).    Physical Exam: Constitutional:  BP 116/69  Pulse 81  Ht 5\' 9"  (1.753 m)  Wt 206 lb (93.441 kg)  BMI 30.41 kg/m2  Musculoskeletal: Strength &  Muscle Tone: within normal limits Gait & Station: normal Patient leans: N/A  Mental Status Examination;  Patient is casually dressed and fairly groomed.  She maintains good eye contact.  Her speech is clear and coherent.  Her thought process is logical and goal-directed.  She denies any auditory or visual hallucination.  She denies any active or any passive suicidal thoughts.  Her  attention and concentration is fair.  There were no tremors or shakes.  She describes her mood as good affect is mood appropriate.  She is alert and oriented x3.  Her insight judgment and impulse control is okay.    Established Problem, Stable/Improving (1), Review of Psycho-Social Stressors (1), Review or order clinical lab tests (1), Review of Last Therapy Session (1) and Review of Medication Regimen & Side Effects (2)  Assessment: Axis I: Bipolar disorder  Axis II: Deferred  Axis III:  Past Medical History  Diagnosis Date  . Bipolar disorder   . Lower extremity edema   . Hypothyroidism   . Headache(784.0)   . Depression   . Anxiety   . GERD (gastroesophageal reflux disease)     Erosive reflux esophagitis    Axis IV: Mild   Plan:   reinforce to keep her appointment .  Continue Lamictal and restaurant at present dose.  We will order a CBC, CMP, hemoglobin A1c since patient do not have blood work and a lot.  Discussed the risks and benefits of medication and recommended to call us back if she has any question or a concern.  Followup in 3 months.  Time spent 25 minutes.  More than 50% of the time spent in psychoeducation, counseling and coordination of care.  Discuss safety plan that anytime having active suicidal thoughts or homicidal thoughts then patient need to call 911 or go to the local emergency room.    Whitney Lopez T., MD 06/30/2014

## 2014-08-09 ENCOUNTER — Telehealth: Payer: Self-pay

## 2014-08-09 NOTE — Telephone Encounter (Signed)
LMOM for a return call. ( this RMR pt). Per Laban Emperor, NP, last OV notes, pt was told to come back in the office to get scheduled.

## 2014-08-09 NOTE — Telephone Encounter (Signed)
Pt is calling to see if she needs a office visit before she can set up a TCS. This will be her first one. Her call back number is [phone]. She said you may leave a message.

## 2014-08-16 LAB — COMPREHENSIVE METABOLIC PANEL
ALT: 14 U/L (ref 0–35)
AST: 13 U/L (ref 0–37)
Albumin: 3.9 g/dL (ref 3.5–5.2)
Alkaline Phosphatase: 79 U/L (ref 39–117)
BILIRUBIN TOTAL: 0.3 mg/dL (ref 0.2–1.2)
BUN: 8 mg/dL (ref 6–23)
CO2: 26 mEq/L (ref 19–32)
Calcium: 8.9 mg/dL (ref 8.4–10.5)
Chloride: 103 mEq/L (ref 96–112)
Creat: 0.75 mg/dL (ref 0.50–1.10)
GLUCOSE: 78 mg/dL (ref 70–99)
Potassium: 4.2 mEq/L (ref 3.5–5.3)
Sodium: 139 mEq/L (ref 135–145)
Total Protein: 6.5 g/dL (ref 6.0–8.3)

## 2014-08-16 LAB — CBC WITH DIFFERENTIAL/PLATELET
BASOS PCT: 0 % (ref 0–1)
Basophils Absolute: 0 10*3/uL (ref 0.0–0.1)
EOS ABS: 0.1 10*3/uL (ref 0.0–0.7)
Eosinophils Relative: 1 % (ref 0–5)
HEMATOCRIT: 40.7 % (ref 36.0–46.0)
HEMOGLOBIN: 13.9 g/dL (ref 12.0–15.0)
Lymphocytes Relative: 38 % (ref 12–46)
Lymphs Abs: 2.9 10*3/uL (ref 0.7–4.0)
MCH: 30.1 pg (ref 26.0–34.0)
MCHC: 34.2 g/dL (ref 30.0–36.0)
MCV: 88.1 fL (ref 78.0–100.0)
MONO ABS: 0.7 10*3/uL (ref 0.1–1.0)
MONOS PCT: 9 % (ref 3–12)
NEUTROS PCT: 52 % (ref 43–77)
Neutro Abs: 4 10*3/uL (ref 1.7–7.7)
Platelets: 263 10*3/uL (ref 150–400)
RBC: 4.62 MIL/uL (ref 3.87–5.11)
RDW: 13.3 % (ref 11.5–15.5)
WBC: 7.7 10*3/uL (ref 4.0–10.5)

## 2014-08-16 LAB — HEMOGLOBIN A1C
HEMOGLOBIN A1C: 6 % — AB (ref <5.7)
Mean Plasma Glucose: 126 mg/dL — ABNORMAL HIGH (ref <117)

## 2014-08-25 NOTE — Telephone Encounter (Signed)
Letter mailed to pt to call for ov appt to come in and get scheduled for colonoscopy.

## 2014-09-07 ENCOUNTER — Telehealth: Payer: Self-pay

## 2014-09-07 NOTE — Telephone Encounter (Signed)
Per Laban Emperor, NP, call pt to triage for the colonoscopy. I called pt but she says she has constipation and only has one BM weekly. Per Vicente Males , pt may keep the appt on Thurs, she does need to be seen prior to scheduling colonoscopy. Pt is aware.

## 2014-09-09 ENCOUNTER — Ambulatory Visit (HOSPITAL_COMMUNITY)
Admission: RE | Admit: 2014-09-09 | Discharge: 2014-09-09 | Disposition: A | Discharge status: Home / Self Care | Payer: 59 | Source: Ambulatory Visit | Attending: Gastroenterology | Admitting: Gastroenterology

## 2014-09-09 ENCOUNTER — Encounter: Payer: Self-pay | Admitting: Gastroenterology

## 2014-09-09 ENCOUNTER — Ambulatory Visit (INDEPENDENT_AMBULATORY_CARE_PROVIDER_SITE_OTHER): Payer: 59 | Admitting: Gastroenterology

## 2014-09-09 VITALS — BP 120/83 | HR 80 | Temp 97.4°F | Ht 69.5 in | Wt 213.4 lb

## 2014-09-09 DIAGNOSIS — R911 Solitary pulmonary nodule: Secondary | ICD-10-CM | POA: Insufficient documentation

## 2014-09-09 DIAGNOSIS — R059 Cough, unspecified: Secondary | ICD-10-CM | POA: Insufficient documentation

## 2014-09-09 DIAGNOSIS — R05 Cough: Secondary | ICD-10-CM | POA: Insufficient documentation

## 2014-09-09 DIAGNOSIS — K5900 Constipation, unspecified: Secondary | ICD-10-CM | POA: Insufficient documentation

## 2014-09-09 DIAGNOSIS — Z1211 Encounter for screening for malignant neoplasm of colon: Secondary | ICD-10-CM

## 2014-09-09 HISTORY — DX: Encounter for screening for malignant neoplasm of colon: Z12.11

## 2014-09-09 HISTORY — DX: Constipation, unspecified: K59.00

## 2014-09-09 MED ORDER — PEG-KCL-NACL-NASULF-NA ASC-C 100 G PO SOLR: 1.0000 | ORAL | Status: DC

## 2014-09-09 NOTE — Addendum Note (Signed)
Addended by: Idamae Schuller on: 09/09/2014 10:04 AM   Modules accepted: Orders

## 2014-09-09 NOTE — Patient Instructions (Signed)
Please complete the chest xray.   For constipation: I have provided samples of 2 different medications. Start with AMITIZA first. Take one gelcap WITH FOOD twice a day. Do this for about a week. If you like this, let me know. If not, you may trial Linzess 1 capsule each morning on an EMPTY stomach, at least 30 minutes before breakfast. Call me with which one you like best, and I will send in a prescription.   We have scheduled you for a routine screening colonoscopy with Dr. Gala Romney in the near future.

## 2014-09-09 NOTE — Progress Notes (Signed)
cc'ed to pcp °

## 2014-09-09 NOTE — Assessment & Plan Note (Signed)
On chest xray in 2014. Repeat now for documentation of stability.

## 2014-09-09 NOTE — Progress Notes (Signed)
Quick Note:  CXR with unchanged lung nodule. No further imaging. Benign finding. ______

## 2014-09-09 NOTE — Progress Notes (Signed)
Referring Provider: Redmond School, MD Primary Care Physician:  Glo Herring., MD Primary GI: Dr. Gala Romney   Chief Complaint  Patient presents with  . Colonoscopy    HPI:   53 year old female presents today with history of GERD, dysphagia, last EGD in Nov 2012 with erosive reflux esophagitis, s/p empiric dilation. UGI/BPE performed April 2013 with normal esophageal distension, no mass, no delay of barium tablet, normal esophageal motility. Due for initial screening colonoscopy at this time.   Had trialed Dexilant every other day but has to take daily for control of symptoms. No dysphagia. Chronic constipation. Will go about once a week, usually Sundays. "Sunday is my day". Would take a laxative on Saturday night in order to have BM finally on Sunday. No prescriptive agents for constipation. No rectal bleeding. Associated abdominal discomfort and bloating with constipation. Eating high fiber diet, trying to do weight watchers. Drinking 3-4 bottles of water a day.   Chest xray Feb 2014 with 68mm nodular density in left lower lobe of lung, likely granuloma. Needs follow-up xray.    Past Medical History  Diagnosis Date  . Bipolar disorder   . Lower extremity edema   . Hypothyroidism   . Headache(784.0)   . Depression   . Anxiety   . GERD (gastroesophageal reflux disease)     Erosive reflux esophagitis    Past Surgical History  Procedure Laterality Date  . S/p hysterectomy    . Clavicle repair    . Cholecystectomy    . Esophagogastroduodenoscopy  11/02/2011    RMR: Four-quadrant erosive reflux esophagitis, gastric and bulbar erosions, H. pylori negative  . Maloney dilation  11/02/2011    1F    Current Outpatient Prescriptions  Medication Sig Dispense Refill  . dexlansoprazole (DEXILANT) 60 MG capsule Take 1 capsule (60 mg total) by mouth daily.  30 capsule  3  . lamoTRIgine (LAMICTAL) 200 MG tablet Take 1 tablet (200 mg total) by mouth daily.  90 tablet  0  .  levothyroxine (SYNTHROID, LEVOTHROID) 175 MCG tablet Take 200 mcg by mouth daily.       . risperiDONE (RISPERDAL) 2 MG tablet Take 1 tablet (2 mg total) by mouth at bedtime.  90 tablet  0   No current facility-administered medications for this visit.    Allergies as of 09/09/2014 - Review Complete 09/09/2014  Allergen Reaction Noted  . Penicillins Hives 10/11/2011    Family History  Problem Relation Age of Onset  . Colon cancer Neg Hx   . Ulcers Mother     History   Social History  . Marital Status: Widowed    Spouse Name: N/A    Number of Children: 0  . Years of Education: N/A   Occupational History  .  [employer]   Social History Main Topics  . Smoking status: Current Some Day Smoker -- 1.00 packs/day for 34 years    Types: Cigarettes  . Smokeless tobacco: None  . Alcohol Use: No  . Drug Use: No  . Sexual Activity: None   Other Topics Concern  . None   Social History Narrative  . None    Review of Systems: As mentioned in HPI.   Physical Exam: BP 120/83  Pulse 80  Temp(Src) 97.4 F (36.3 C) (Oral)  Ht 5' 9.5" (1.765 m)  Wt 213 lb 6.4 oz (96.798 kg)  BMI 31.07 kg/m2 General:   Alert and oriented. No distress noted. Pleasant and cooperative.  Head:  Normocephalic and atraumatic. Eyes:  Conjuctiva clear without scleral icterus. Mouth:  Oral mucosa pink and moist. Good dentition. No lesions. Heart:  S1, S2 present without murmurs, rubs, or gallops. Regular rate and rhythm. Abdomen:  +BS, soft, non-tender and non-distended. No rebound or guarding. No HSM or masses noted. Msk:  Symmetrical without gross deformities. Normal posture. Extremities:  Without edema. Neurologic:  Alert and  oriented x4;  grossly normal neurologically. Skin:  Intact without significant lesions or rashes. Psych:  Alert and cooperative. Normal mood and affect.

## 2014-09-09 NOTE — Assessment & Plan Note (Signed)
Chronic without concerning features. Have provided samples of Amitiza 24 mcg to trial first; I have also provided short course of Linzess 145 mcg. She is to let me know which one she would prefer to have sent to her pharmacy. I feel she may do better with Amitiza do to work schedule. Patient to call with update.

## 2014-09-09 NOTE — Assessment & Plan Note (Signed)
53 year old female with need for initial average risk screening colonoscopy. No concerning lower GI symptoms. No FH of colon cancer.   Proceed with TCS with Dr. Gala Romney in near future: the risks, benefits, and alternatives have been discussed with the patient in detail. The patient states understanding and desires to proceed.

## 2014-09-13 ENCOUNTER — Telehealth: Payer: Self-pay | Admitting: Internal Medicine

## 2014-09-13 NOTE — Telephone Encounter (Signed)
PATIENT CALLED INQUIRING ABOUT X RAY RESULTS SHE HAD LAST WEEK   [phone]

## 2014-09-13 NOTE — Telephone Encounter (Signed)
Pt is aware of her results.  

## 2014-09-14 ENCOUNTER — Encounter (HOSPITAL_COMMUNITY): Payer: Self-pay | Admitting: Pharmacy Technician

## 2014-09-15 ENCOUNTER — Telehealth: Payer: Self-pay | Admitting: Internal Medicine

## 2014-09-15 NOTE — Telephone Encounter (Signed)
Patient called asking to speak with JL with a quick question about her colonoscopy that's coming up on 10/13 with RMR. Looks like DS/CM scheduled procedure not sure which to route phone note too. Please call patient back at [phone]

## 2014-09-15 NOTE — Telephone Encounter (Signed)
I called pt and she said she finally got someone to check her ear and she has mercer. She will see ENT doctor tomorrow. She just thought that Dr. Gala Romney would not want to do colonoscopy until after that has cleared up. Routing to Vicente Males to advise!

## 2014-09-15 NOTE — Telephone Encounter (Signed)
Should be fine to proceed with colonoscopy. She will have been treated by that point, as it is 2 weeks away.

## 2014-09-17 NOTE — Telephone Encounter (Signed)
Pt is aware.  

## 2014-09-28 ENCOUNTER — Encounter (HOSPITAL_COMMUNITY): Payer: Self-pay

## 2014-09-28 ENCOUNTER — Ambulatory Visit (HOSPITAL_COMMUNITY)
Admission: RE | Admit: 2014-09-28 | Discharge: 2014-09-28 | Disposition: A | Discharge status: Home / Self Care | Payer: 59 | Source: Ambulatory Visit | Attending: Internal Medicine | Admitting: Internal Medicine

## 2014-09-28 ENCOUNTER — Encounter (HOSPITAL_COMMUNITY)
Admission: RE | Disposition: A | Discharge status: Home / Self Care | Payer: Self-pay | Source: Ambulatory Visit | Attending: Internal Medicine

## 2014-09-28 DIAGNOSIS — K210 Gastro-esophageal reflux disease with esophagitis: Secondary | ICD-10-CM | POA: Diagnosis not present

## 2014-09-28 DIAGNOSIS — F419 Anxiety disorder, unspecified: Secondary | ICD-10-CM | POA: Insufficient documentation

## 2014-09-28 DIAGNOSIS — K635 Polyp of colon: Secondary | ICD-10-CM

## 2014-09-28 DIAGNOSIS — F319 Bipolar disorder, unspecified: Secondary | ICD-10-CM | POA: Diagnosis not present

## 2014-09-28 DIAGNOSIS — K621 Rectal polyp: Secondary | ICD-10-CM | POA: Insufficient documentation

## 2014-09-28 DIAGNOSIS — F329 Major depressive disorder, single episode, unspecified: Secondary | ICD-10-CM | POA: Diagnosis not present

## 2014-09-28 DIAGNOSIS — R1310 Dysphagia, unspecified: Secondary | ICD-10-CM | POA: Insufficient documentation

## 2014-09-28 DIAGNOSIS — D124 Benign neoplasm of descending colon: Secondary | ICD-10-CM | POA: Insufficient documentation

## 2014-09-28 DIAGNOSIS — D125 Benign neoplasm of sigmoid colon: Secondary | ICD-10-CM | POA: Diagnosis not present

## 2014-09-28 DIAGNOSIS — F17210 Nicotine dependence, cigarettes, uncomplicated: Secondary | ICD-10-CM | POA: Insufficient documentation

## 2014-09-28 DIAGNOSIS — E039 Hypothyroidism, unspecified: Secondary | ICD-10-CM | POA: Insufficient documentation

## 2014-09-28 DIAGNOSIS — K831 Obstruction of bile duct: Secondary | ICD-10-CM

## 2014-09-28 DIAGNOSIS — Z880 Allergy status to penicillin: Secondary | ICD-10-CM | POA: Diagnosis not present

## 2014-09-28 DIAGNOSIS — K5730 Diverticulosis of large intestine without perforation or abscess without bleeding: Secondary | ICD-10-CM | POA: Insufficient documentation

## 2014-09-28 DIAGNOSIS — Z1211 Encounter for screening for malignant neoplasm of colon: Secondary | ICD-10-CM | POA: Insufficient documentation

## 2014-09-28 HISTORY — PX: COLONOSCOPY: SHX5424

## 2014-09-28 SURGERY — COLONOSCOPY
Anesthesia: Moderate Sedation

## 2014-09-28 MED ORDER — ONDANSETRON HCL 4 MG/2ML IJ SOLN
INTRAMUSCULAR | Status: DC | PRN
  Administered 2014-09-28: 4 mg via INTRAVENOUS

## 2014-09-28 MED ORDER — MEPERIDINE HCL 100 MG/ML IJ SOLN
INTRAMUSCULAR | Status: DC | PRN
  Administered 2014-09-28: 50 mg via INTRAVENOUS
  Administered 2014-09-28 (×2): 25 mg via INTRAVENOUS

## 2014-09-28 MED ORDER — MIDAZOLAM HCL 5 MG/5ML IJ SOLN
INTRAMUSCULAR | Status: DC | PRN
  Administered 2014-09-28: 2 mg via INTRAVENOUS
  Administered 2014-09-28 (×2): 1 mg via INTRAVENOUS
  Administered 2014-09-28: 2 mg via INTRAVENOUS

## 2014-09-28 MED ORDER — MIDAZOLAM HCL 5 MG/5ML IJ SOLN
INTRAMUSCULAR | Status: AC
  Filled 2014-09-28: qty 10

## 2014-09-28 MED ORDER — MEPERIDINE HCL 100 MG/ML IJ SOLN
INTRAMUSCULAR | Status: AC
  Filled 2014-09-28: qty 2

## 2014-09-28 MED ORDER — STERILE WATER FOR IRRIGATION IR SOLN
Status: DC | PRN
  Administered 2014-09-28: 14:00:00

## 2014-09-28 MED ORDER — ONDANSETRON HCL 4 MG/2ML IJ SOLN
INTRAMUSCULAR | Status: AC
  Filled 2014-09-28: qty 2

## 2014-09-28 MED ORDER — SODIUM CHLORIDE 0.9 % IV SOLN
INTRAVENOUS | Status: DC
  Administered 2014-09-28: 13:00:00 via INTRAVENOUS

## 2014-09-28 NOTE — Op Note (Signed)
[facility] [address] [place], [address]   COLONOSCOPY PROCEDURE REPORT  PATIENT: Whitney Lopez, Whitney Lopez  MR#: #[id number] BIRTHDATE: 1961/06/01 , 54  yrs. old GENDER: female ENDOSCOPIST: R.  Garfield Cornea, MD FACP [facility] REFERRED BY: PROCEDURE DATE:  23-Oct-2014 PROCEDURE:   Colonoscopy, with biopsy INDICATIONS:average risk for colorectal cancer and first colonoscopy. MEDICATIONS: Versed 6 mg IV and Demerol 100 mg IV in divided doses. Several 4 mg IV. ASA CLASS:       Class II  CONSENT: The risks, benefits, alternatives and imponderables including but not limited to bleeding, perforation as well as the possibility of a missed lesion have been reviewed.  The potential for biopsy, lesion removal, etc. have also been discussed. Questions have been answered.  All parties agreeable.  Please see the history and physical in the medical record for more information.  DESCRIPTION OF PROCEDURE:   After the risks benefits and alternatives of the procedure were thoroughly explained, informed consent was obtained.  The digital rectal exam revealed no abnormalities of the rectum and revealed no rectal mass.   The [id number] ([id number])  endoscope was introduced through the anus and advanced to the cecum, which was identified by both the appendix and ileocecal valve. No adverse events experienced.   The quality of the prep was adequate.  The instrument was then slowly withdrawn as the colon was fully examined.      COLON FINDINGS: Patient had (1) diminutive polyp at 3 cm in from the anal verge; otherwise, the remainder of the rectal mucosa appeared normal; the patient had (1) diminutive polyp in the mid sigmoid and one in the mid descending segment; the patient had pancolonic diverticulosis.  The remainder of the colonic mucosa appeared normal.     .  All the above-mentioned polyps were cold biopsied/removed.  Withdrawal time=  .  The scope was withdrawn and the  procedure completed. COMPLICATIONS: There were no immediate complications.  ENDOSCOPIC IMPRESSION: 1.   Pancolonic diverticulosis.  Rectal and colonic polyps removed as described above.  RECOMMENDATIONS: Followup on pathology.  Benefiber daily. MiraLax daily when necessary constipation.  eSigned:  R. Garfield Cornea, MD Rosalita Chessman [facility] 10-23-2014 3:06 PM   cc:  CPT CODES: ICD CODES:  The ICD and CPT codes recommended by this software are interpretations from the data that the clinical staff has captured with the software.  The verification of the translation of this report to the ICD and CPT codes and modifiers is the sole responsibility of the health care institution and practicing physician where this report was generated.  [employer] will not be held responsible for the validity of the ICD and CPT codes included on this report.  AMA assumes no liability for data contained or not contained herein. CPT is a Designer, television/film set of the [employer].  PATIENT NAME:  Whitney Lopez, Whitney Lopez MR#: #[id number]

## 2014-09-28 NOTE — Interval H&P Note (Signed)
History and Physical Interval Note:  09/28/2014 1:59 PM  TIA Whitney Lopez  has presented today for surgery, with the diagnosis of SCREENING TCS  The various methods of treatment have been discussed with the patient and family. After consideration of risks, benefits and other options for treatment, the patient has consented to  Procedure(s) with comments: COLONOSCOPY (N/A) - 2:00 PM as a surgical intervention .  The patient's history has been reviewed, patient examined, no change in status, stable for surgery.  I have reviewed the patient's chart and labs.  Questions were answered to the patient's satisfaction.     [name]  No change. Screening colonoscopy per plan.The risks, benefits, limitations, alternatives and imponderables have been reviewed with the patient. Questions have been answered. All parties are agreeable.

## 2014-09-28 NOTE — H&P (View-Only) (Signed)
Referring Provider: Redmond School, MD Primary Care Physician:  Glo Herring., MD Primary GI: Dr. Gala Romney   Chief Complaint  Patient presents with  . Colonoscopy    HPI:   53 year old female presents today with history of GERD, dysphagia, last EGD in Nov 2012 with erosive reflux esophagitis, s/p empiric dilation. UGI/BPE performed April 2013 with normal esophageal distension, no mass, no delay of barium tablet, normal esophageal motility. Due for initial screening colonoscopy at this time.   Had trialed Dexilant every other day but has to take daily for control of symptoms. No dysphagia. Chronic constipation. Will go about once a week, usually Sundays. "Sunday is my day". Would take a laxative on Saturday night in order to have BM finally on Sunday. No prescriptive agents for constipation. No rectal bleeding. Associated abdominal discomfort and bloating with constipation. Eating high fiber diet, trying to do weight watchers. Drinking 3-4 bottles of water a day.   Chest xray Feb 2014 with 55mm nodular density in left lower lobe of lung, likely granuloma. Needs follow-up xray.    Past Medical History  Diagnosis Date  . Bipolar disorder   . Lower extremity edema   . Hypothyroidism   . Headache(784.0)   . Depression   . Anxiety   . GERD (gastroesophageal reflux disease)     Erosive reflux esophagitis    Past Surgical History  Procedure Laterality Date  . S/p hysterectomy    . Clavicle repair    . Cholecystectomy    . Esophagogastroduodenoscopy  11/02/2011    RMR: Four-quadrant erosive reflux esophagitis, gastric and bulbar erosions, H. pylori negative  . Maloney dilation  11/02/2011    12F    Current Outpatient Prescriptions  Medication Sig Dispense Refill  . dexlansoprazole (DEXILANT) 60 MG capsule Take 1 capsule (60 mg total) by mouth daily.  30 capsule  3  . lamoTRIgine (LAMICTAL) 200 MG tablet Take 1 tablet (200 mg total) by mouth daily.  90 tablet  0  .  levothyroxine (SYNTHROID, LEVOTHROID) 175 MCG tablet Take 200 mcg by mouth daily.       . risperiDONE (RISPERDAL) 2 MG tablet Take 1 tablet (2 mg total) by mouth at bedtime.  90 tablet  0   No current facility-administered medications for this visit.    Allergies as of 09/09/2014 - Review Complete 09/09/2014  Allergen Reaction Noted  . Penicillins Hives 10/11/2011    Family History  Problem Relation Age of Onset  . Colon cancer Neg Hx   . Ulcers Mother     History   Social History  . Marital Status: Widowed    Spouse Name: N/A    Number of Children: 0  . Years of Education: N/A   Occupational History  .  [employer]   Social History Main Topics  . Smoking status: Current Some Day Smoker -- 1.00 packs/day for 34 years    Types: Cigarettes  . Smokeless tobacco: None  . Alcohol Use: No  . Drug Use: No  . Sexual Activity: None   Other Topics Concern  . None   Social History Narrative  . None    Review of Systems: As mentioned in HPI.   Physical Exam: BP 120/83  Pulse 80  Temp(Src) 97.4 F (36.3 C) (Oral)  Ht 5' 9.5" (1.765 m)  Wt 213 lb 6.4 oz (96.798 kg)  BMI 31.07 kg/m2 General:   Alert and oriented. No distress noted. Pleasant and cooperative.  Head:  Normocephalic and atraumatic. Eyes:  Conjuctiva clear without scleral icterus. Mouth:  Oral mucosa pink and moist. Good dentition. No lesions. Heart:  S1, S2 present without murmurs, rubs, or gallops. Regular rate and rhythm. Abdomen:  +BS, soft, non-tender and non-distended. No rebound or guarding. No HSM or masses noted. Msk:  Symmetrical without gross deformities. Normal posture. Extremities:  Without edema. Neurologic:  Alert and  oriented x4;  grossly normal neurologically. Skin:  Intact without significant lesions or rashes. Psych:  Alert and cooperative. Normal mood and affect.

## 2014-09-28 NOTE — Discharge Instructions (Signed)
Colonoscopy Discharge Instructions  Read the instructions outlined below and refer to this sheet in the next few weeks. These discharge instructions provide you with general information on caring for yourself after you leave the hospital. Your doctor may also give you specific instructions. While your treatment has been planned according to the most current medical practices available, unavoidable complications occasionally occur. If you have any problems or questions after discharge, call Dr. Gala Romney at [phone]. ACTIVITY  You may resume your regular activity, but move at a slower pace for the next 24 hours.   Take frequent rest periods for the next 24 hours.   Walking will help get rid of the air and reduce the bloated feeling in your belly (abdomen).   No driving for 24 hours (because of the medicine (anesthesia) used during the test).    Do not sign any important legal documents or operate any machinery for 24 hours (because of the anesthesia used during the test).  NUTRITION  Drink plenty of fluids.   You may resume your normal diet as instructed by your doctor.   Begin with a light meal and progress to your normal diet. Heavy or fried foods are harder to digest and may make you feel sick to your stomach (nauseated).   Avoid alcoholic beverages for 24 hours or as instructed.  MEDICATIONS  You may resume your normal medications unless your doctor tells you otherwise.  WHAT YOU CAN EXPECT TODAY  Some feelings of bloating in the abdomen.   Passage of more gas than usual.   Spotting of blood in your stool or on the toilet paper.  IF YOU HAD POLYPS REMOVED DURING THE COLONOSCOPY:  No aspirin products for 7 days or as instructed.   No alcohol for 7 days or as instructed.   Eat a soft diet for the next 24 hours.  FINDING OUT THE RESULTS OF YOUR TEST Not all test results are available during your visit. If your test results are not back during the visit, make an appointment  with your caregiver to find out the results. Do not assume everything is normal if you have not heard from your caregiver or the medical facility. It is important for you to follow up on all of your test results.  SEEK IMMEDIATE MEDICAL ATTENTION IF:  You have more than a spotting of blood in your stool.   Your belly is swollen (abdominal distention).   You are nauseated or vomiting.   You have a temperature over 101.   You have abdominal pain or discomfort that is severe or gets worse throughout the day.     Polyp and diverticulosis information provided  Constipation information provided  Benefiber 2 teaspoons twice daily  Use MiraLax one cap daily as constipation  Further recommendations to follow pending review of pathology report   Constipation Constipation is when a person has fewer than three bowel movements a week, has difficulty having a bowel movement, or has stools that are dry, hard, or larger than normal. As people grow older, constipation is more common. If you try to fix constipation with medicines that make you have a bowel movement (laxatives), the problem may get worse. Long-term laxative use may cause the muscles of the colon to become weak. A low-fiber diet, not taking in enough fluids, and taking certain medicines may make constipation worse.  CAUSES   Certain medicines, such as antidepressants, pain medicine, iron supplements, antacids, and water pills.   Certain diseases, such as diabetes, irritable  bowel syndrome (IBS), thyroid disease, or depression.   Not drinking enough water.   Not eating enough fiber-rich foods.   Stress or travel.   Lack of physical activity or exercise.   Ignoring the urge to have a bowel movement.   Using laxatives too much.  SIGNS AND SYMPTOMS   Having fewer than three bowel movements a week.   Straining to have a bowel movement.   Having stools that are hard, dry, or larger than normal.   Feeling full or  bloated.   Pain in the lower abdomen.   Not feeling relief after having a bowel movement.  DIAGNOSIS  Your health care provider will take a medical history and perform a physical exam. Further testing may be done for severe constipation. Some tests may include:  A barium enema X-ray to examine your rectum, colon, and, sometimes, your small intestine.   A sigmoidoscopy to examine your lower colon.   A colonoscopy to examine your entire colon. TREATMENT  Treatment will depend on the severity of your constipation and what is causing it. Some dietary treatments include drinking more fluids and eating more fiber-rich foods. Lifestyle treatments may include regular exercise. If these diet and lifestyle recommendations do not help, your health care provider may recommend taking over-the-counter laxative medicines to help you have bowel movements. Prescription medicines may be prescribed if over-the-counter medicines do not work.  HOME CARE INSTRUCTIONS   Eat foods that have a lot of fiber, such as fruits, vegetables, whole grains, and beans.  Limit foods high in fat and processed sugars, such as french fries, hamburgers, cookies, candies, and soda.   A fiber supplement may be added to your diet if you cannot get enough fiber from foods.   Drink enough fluids to keep your urine clear or pale yellow.   Exercise regularly or as directed by your health care provider.   Go to the restroom when you have the urge to go. Do not hold it.   Only take over-the-counter or prescription medicines as directed by your health care provider. Do not take other medicines for constipation without talking to your health care provider first.  [place] IF:   You have bright red blood in your stool.   Your constipation lasts for more than 4 days or gets worse.   You have abdominal or rectal pain.   You have thin, pencil-like stools.   You have unexplained weight loss. MAKE  SURE YOU:   Understand these instructions.  Will watch your condition.  Will get help right away if you are not doing well or get worse. Document Released: 08/31/2004 Document Revised: 12/08/2013 Document Reviewed: 09/14/2013 [facility] Patient Information 2015 [place], [place]. This information is not intended to replace advice given to you by your health care provider. Make sure you discuss any questions you have with your health care provider.  Diverticulosis Diverticulosis is the condition that develops when small pouches (diverticula) form in the wall of your colon. Your colon, or large intestine, is where water is absorbed and stool is formed. The pouches form when the inside layer of your colon pushes through weak spots in the outer layers of your colon. CAUSES  No one knows exactly what causes diverticulosis. RISK FACTORS  Being older than 11. Your risk for this condition increases with age. Diverticulosis is rare in people younger than 40 years. By age 91, almost everyone has it.  Eating a low-fiber diet.  Being frequently constipated.  Being overweight.  Not getting enough exercise.  Smoking.  Taking over-the-counter pain medicines, like aspirin and ibuprofen. SYMPTOMS  Most people with diverticulosis do not have symptoms. DIAGNOSIS  Because diverticulosis often has no symptoms, health care providers often discover the condition during an exam for other colon problems. In many cases, a health care provider will diagnose diverticulosis while using a flexible scope to examine the colon (colonoscopy). TREATMENT  If you have never developed an infection related to diverticulosis, you may not need treatment. If you have had an infection before, treatment may include:  Eating more fruits, vegetables, and grains.  Taking a fiber supplement.  Taking a live bacteria supplement (probiotic).  Taking medicine to relax your colon. HOME CARE INSTRUCTIONS   Drink at least 6-8  glasses of water each day to prevent constipation.  Try not to strain when you have a bowel movement.  Keep all follow-up appointments. If you have had an infection before:  Increase the fiber in your diet as directed by your health care provider or dietitian.  Take a dietary fiber supplement if your health care provider approves.  Only take medicines as directed by your health care provider. SEEK MEDICAL CARE IF:   You have abdominal pain.  You have bloating.  You have cramps.  You have not gone to the bathroom in 3 days. SEEK IMMEDIATE MEDICAL CARE IF:   Your pain gets worse.  Yourbloating becomes very bad.  You have a fever or chills, and your symptoms suddenly get worse.  You begin vomiting.  You have bowel movements that are bloody or black. MAKE SURE YOU:  Understand these instructions.  Will watch your condition.  Will get help right away if you are not doing well or get worse. Document Released: 08/30/2004 Document Revised: 12/08/2013 Document Reviewed: 10/28/2013 [facility] Patient Information 2015 [place], [place]. This information is not intended to replace advice given to you by your health care provider. Make sure you discuss any questions you have with your health care provider.  Colon Polyps Polyps are lumps of extra tissue growing inside the body. Polyps can grow in the large intestine (colon). Most colon polyps are noncancerous (benign). However, some colon polyps can become cancerous over time. Polyps that are larger than a pea may be harmful. To be safe, caregivers remove and test all polyps. CAUSES  Polyps form when mutations in the genes cause your cells to grow and divide even though no more tissue is needed. RISK FACTORS There are a number of risk factors that can increase your chances of getting colon polyps. They include:  Being older than 50 years.  Family history of colon polyps or colon cancer.  Long-term colon diseases, such as colitis  or Crohn disease.  Being overweight.  Smoking.  Being inactive.  Drinking too much alcohol. SYMPTOMS  Most small polyps do not cause symptoms. If symptoms are present, they may include:  Blood in the stool. The stool may look dark red or black.  Constipation or diarrhea that lasts longer than 1 week. DIAGNOSIS People often do not know they have polyps until their caregiver finds them during a regular checkup. Your caregiver can use 4 tests to check for polyps:  Digital rectal exam. The caregiver wears gloves and feels inside the rectum. This test would find polyps only in the rectum.  Barium enema. The caregiver puts a liquid called barium into your rectum before taking X-rays of your colon. Barium makes your colon look white. Polyps are dark, so they are  easy to see in the X-ray pictures.  Sigmoidoscopy. A thin, flexible tube (sigmoidoscope) is placed into your rectum. The sigmoidoscope has a light and tiny camera in it. The caregiver uses the sigmoidoscope to look at the last third of your colon.  Colonoscopy. This test is like sigmoidoscopy, but the caregiver looks at the entire colon. This is the most common method for finding and removing polyps. TREATMENT  Any polyps will be removed during a sigmoidoscopy or colonoscopy. The polyps are then tested for cancer. PREVENTION  To help lower your risk of getting more colon polyps:  Eat plenty of fruits and vegetables. Avoid eating fatty foods.  Do not smoke.  Avoid drinking alcohol.  Exercise every day.  Lose weight if recommended by your caregiver.  Eat plenty of calcium and folate. Foods that are rich in calcium include milk, cheese, and broccoli. Foods that are rich in folate include chickpeas, kidney beans, and spinach. HOME CARE INSTRUCTIONS Keep all follow-up appointments as directed by your caregiver. You may need periodic exams to check for polyps. SEEK MEDICAL CARE IF: You notice bleeding during a bowel  movement. Document Released: 08/29/2004 Document Revised: 02/25/2012 Document Reviewed: 02/12/2012 [facility] Patient Information 2015 [place], [place]. This information is not intended to replace advice given to you by your health care provider. Make sure you discuss any questions you have with your health care provider.

## 2014-09-29 ENCOUNTER — Encounter (HOSPITAL_COMMUNITY): Payer: Self-pay | Admitting: Internal Medicine

## 2014-10-04 ENCOUNTER — Encounter: Payer: Self-pay | Admitting: Internal Medicine

## 2014-10-04 ENCOUNTER — Ambulatory Visit (HOSPITAL_COMMUNITY): Payer: Self-pay | Admitting: Psychiatry

## 2015-05-19 ENCOUNTER — Other Ambulatory Visit: Payer: Self-pay | Admitting: Gastroenterology

## 2015-11-01 ENCOUNTER — Other Ambulatory Visit: Payer: Self-pay

## 2015-11-02 MED ORDER — DEXLANSOPRAZOLE 60 MG PO CPDR: 1.0000 | DELAYED_RELEASE_CAPSULE | Freq: Every day | ORAL | Status: DC

## 2016-02-16 ENCOUNTER — Other Ambulatory Visit: Payer: Self-pay

## 2016-02-16 ENCOUNTER — Ambulatory Visit (INDEPENDENT_AMBULATORY_CARE_PROVIDER_SITE_OTHER): Payer: Commercial Managed Care - HMO | Admitting: Gastroenterology

## 2016-02-16 ENCOUNTER — Encounter: Payer: Self-pay | Admitting: Gastroenterology

## 2016-02-16 VITALS — BP 135/83 | HR 72 | Temp 98.2°F | Ht 69.0 in | Wt 240.8 lb

## 2016-02-16 DIAGNOSIS — K5900 Constipation, unspecified: Secondary | ICD-10-CM | POA: Diagnosis not present

## 2016-02-16 DIAGNOSIS — K208 Other esophagitis: Secondary | ICD-10-CM | POA: Diagnosis not present

## 2016-02-16 DIAGNOSIS — R109 Unspecified abdominal pain: Secondary | ICD-10-CM | POA: Diagnosis not present

## 2016-02-16 DIAGNOSIS — K2210 Ulcer of esophagus without bleeding: Secondary | ICD-10-CM

## 2016-02-16 HISTORY — DX: Unspecified abdominal pain: R10.9

## 2016-02-16 HISTORY — DX: Constipation, unspecified: K59.00

## 2016-02-16 LAB — CBC
HCT: 41.4 % (ref 36.0–46.0)
HEMOGLOBIN: 13.6 g/dL (ref 12.0–15.0)
MCH: 30.4 pg (ref 26.0–34.0)
MCHC: 32.9 g/dL (ref 30.0–36.0)
MCV: 92.6 fL (ref 78.0–100.0)
MPV: 11.9 fL (ref 8.6–12.4)
Platelets: 231 10*3/uL (ref 150–400)
RBC: 4.47 MIL/uL (ref 3.87–5.11)
RDW: 13.2 % (ref 11.5–15.5)
WBC: 10.3 10*3/uL (ref 4.0–10.5)

## 2016-02-16 LAB — COMPLETE METABOLIC PANEL WITH GFR
ALT: 23 U/L (ref 6–29)
AST: 18 U/L (ref 10–35)
Albumin: 3.9 g/dL (ref 3.6–5.1)
Alkaline Phosphatase: 82 U/L (ref 33–130)
BUN: 10 mg/dL (ref 7–25)
CHLORIDE: 100 mmol/L (ref 98–110)
CO2: 27 mmol/L (ref 20–31)
Calcium: 9.2 mg/dL (ref 8.6–10.4)
Creat: 0.65 mg/dL (ref 0.50–1.05)
GFR, Est Non African American: >89 mL/min (ref >=60)
Glucose, Bld: 87 mg/dL (ref 65–99)
Potassium: 4.5 mmol/L (ref 3.5–5.3)
Sodium: 134 mmol/L — ABNORMAL LOW (ref 135–146)
Total Bilirubin: 0.4 mg/dL (ref 0.2–1.2)
Total Protein: 6.8 g/dL (ref 6.1–8.1)

## 2016-02-16 LAB — TSH: TSH: 3.5 m[IU]/L

## 2016-02-16 MED ORDER — PANTOPRAZOLE SODIUM 40 MG PO TBEC: 40.0000 mg | DELAYED_RELEASE_TABLET | Freq: Two times a day (BID) | ORAL | Status: DC

## 2016-02-16 NOTE — Patient Instructions (Signed)
Stop Dexilant. Start taking Protonix twice a day, 30 minutes before meals.  Stop Aleve pm.  Start taking Linzess 1 capsule 30 minutes before breakfast. This is for constipation. You might have some loose stool the first few days, but it should get better. Let me know how you do with this.  We have ordered a CT scan.  We have scheduled an upper endoscopy with Dr. Gala Romney in the near future.  Further recommendations to follow.

## 2016-02-16 NOTE — Progress Notes (Signed)
Referring Provider: Redmond School, MD Primary Care Physician:  Glo Herring., MD Primary GI: Dr. Gala Romney   Chief Complaint  Patient presents with  . Weight Gain  . diverticulitis    HPI:   Whitney Lopez is a 55 y.o. female presenting today with a history of GERD, dysphagia, last EGD in Nov 2012 with erosive reflux esophagitis, s/p empiric dilation. Colonoscopy up-to-date and due again in 2022.    Has gotten up at night twice vomiting, Sometimes during the day. This occurred while taking antibiotics. No more vomiting since coming off antibiotics. Like acid. Eats chili made out of bison and elk, uses diced tomatoes. Still nauseated now. Refractory GERD. Takes Aleve pm every day for hip.   Chronic abdominal pain, upper abdomen and RLQ. Taking 2 pills of Dexilant every day. Taking Tums. Eats and belches. Heartburn so bad the other day that she got sick. Water makes her feel sick.   Upper abdominal pain, worsened by foods at times. More pain located in lower abdomen, constant. Noted as a dull ache. Woke up one evening with severe pain in lower abdomen. Paper hematochezia. Hard stools. BM once every 3-4 days, sometimes 5. No fever/chills.   One time on fork lift, got dizzy while in pain.   Past Medical History  Diagnosis Date  . Bipolar disorder ([place])   . Lower extremity edema   . Hypothyroidism   . Headache(784.0)   . Depression   . Anxiety   . GERD (gastroesophageal reflux disease)     Erosive reflux esophagitis    Past Surgical History  Procedure Laterality Date  . S/p hysterectomy    . Clavicle repair    . Cholecystectomy    . Esophagogastroduodenoscopy  11/02/2011    RMR: Four-quadrant erosive reflux esophagitis, gastric and bulbar erosions, H. pylori negative  . Maloney dilation  11/02/2011    61F  . Colonoscopy N/A 09/28/2014    DR. Rourk: 1. pancolonic diverticulosis. Rectal and colonic polyps removed, one tubular adenoma descending colon, otherwise  hyperplastic. Suveillance in 2022    Current Outpatient Prescriptions  Medication Sig Dispense Refill  . alprazolam (XANAX) 2 MG tablet Take 2 mg by mouth 3 (three) times daily.  1  . dexlansoprazole (DEXILANT) 60 MG capsule Take 1 capsule (60 mg total) by mouth daily. 30 capsule 5  . levothyroxine (SYNTHROID, LEVOTHROID) 175 MCG tablet Take 200 mcg by mouth daily.     . pantoprazole (PROTONIX) 40 MG tablet Take 1 tablet (40 mg total) by mouth 2 (two) times daily before a meal. 60 tablet 3   No current facility-administered medications for this visit.    Allergies as of 02/16/2016 - Review Complete 02/16/2016  Allergen Reaction Noted  . Penicillins Hives 10/11/2011    Family History  Problem Relation Age of Onset  . Colon cancer Neg Hx   . Ulcers Mother     Social History   Social History  . Marital Status: Married    Spouse Name: N/A  . Number of Children: 0  . Years of Education: N/A   Occupational History  .  [employer]   Social History Main Topics  . Smoking status: Current Some Day Smoker -- 1.00 packs/day for 34 years    Types: Cigarettes  . Smokeless tobacco: None  . Alcohol Use: No  . Drug Use: No  . Sexual Activity: Not Asked   Other Topics Concern  . None   Social History Narrative    Review of  Systems: As mentioned in HPI.   Physical Exam: BP 135/83 mmHg  Pulse 72  Temp(Src) 98.2 F (36.8 C)  Ht 5\' 9"  (1.753 m)  Wt 240 lb 12.8 oz (109.226 kg)  BMI 35.54 kg/m2 General:   Alert and oriented. No distress noted. Pleasant and cooperative.  Head:  Normocephalic and atraumatic. Eyes:  Conjuctiva clear without scleral icterus. Mouth:  Oral mucosa pink and moist. Good dentition. No lesions. Heart:  S1, S2 present without murmurs, rubs, or gallops. Regular rate and rhythm. Abdomen:  +BS, soft, TTP upper abdomen and questionable ventral hernia. Obese. Non-distended. No rebound or guarding.  Msk:  Symmetrical without gross deformities. Normal  posture. Extremities:  Chronic 2-3+ lower extremity pedal and ankle edema  Neurologic:  Alert and  oriented x4;  grossly normal neurologically. Psych:  Alert and cooperative. Normal mood and affect.

## 2016-02-20 ENCOUNTER — Encounter: Payer: Self-pay | Admitting: Gastroenterology

## 2016-02-21 ENCOUNTER — Ambulatory Visit (HOSPITAL_COMMUNITY)
Admission: RE | Admit: 2016-02-21 | Discharge: 2016-02-21 | Disposition: A | Discharge status: Home / Self Care | Payer: Commercial Managed Care - HMO | Source: Ambulatory Visit | Attending: Gastroenterology | Admitting: Gastroenterology

## 2016-02-21 DIAGNOSIS — R109 Unspecified abdominal pain: Secondary | ICD-10-CM | POA: Insufficient documentation

## 2016-02-21 DIAGNOSIS — N201 Calculus of ureter: Secondary | ICD-10-CM | POA: Insufficient documentation

## 2016-02-21 MED ORDER — IOHEXOL 300 MG/ML  SOLN
100.0000 mL | Freq: Once | INTRAMUSCULAR | Status: AC | PRN
  Administered 2016-02-21: 100 mL via INTRAVENOUS

## 2016-02-21 NOTE — Assessment & Plan Note (Signed)
Likely culprit of vague lower abdominal discomfort and could be contributing to constellation of symptoms as well. Start Linzess 145 mcg once daily. Samples provided. Check TSH.

## 2016-02-21 NOTE — Assessment & Plan Note (Signed)
Refractory GERD despite increased doses of Dexilant likely multifactorial in setting of increased weight, dietary habits, unable to exclude gastritis/PUD due to long-term NSAIDs. EGD as planned. Change to Protonix BID for now.

## 2016-02-21 NOTE — Assessment & Plan Note (Signed)
55 year old female with chronic abdominal pain but now worsening with upper abdominal pain more pronounced than lower abdominal pain. Notes exacerbation of epigastric discomfort with eating, severe GERD despite taking TWO Dexilant capsules a day and Tums. No dysphagia. Weight increased since last visit. Query ventral hernia on exam. However, in the setting of Aleve pm daily, concern for gastritis, PUD. Gallbladder absent.   Will proceed with CT abd/pelvis now Proceed with upper endoscopy in the near future with Dr. Gala Romney. The risks, benefits, and alternatives have been discussed in detail with patient. They have stated understanding and desire to proceed.  Phenergan 25 mg IV on call  TSH, CMP, CBC now

## 2016-02-21 NOTE — Progress Notes (Signed)
cc'ed to pcp °

## 2016-02-22 ENCOUNTER — Telehealth: Payer: Self-pay | Admitting: Internal Medicine

## 2016-02-22 NOTE — Telephone Encounter (Signed)
I called pt and she is aware we have 7-10 business days to call with results. She said no problem, and I told her I would let Laban Emperor, NP know that she had called.

## 2016-02-22 NOTE — Telephone Encounter (Signed)
PATIENT CALLED INQUIRING IF HER LAB RESULTS WERE IN   PLEASE ADVISE AS JULIE IS NOT HERE [phone]

## 2016-02-23 NOTE — Telephone Encounter (Signed)
noted 

## 2016-02-24 NOTE — Progress Notes (Signed)
Quick Note:  She has a stone in the right ureter that is likely causing her pain. No ventral hernia. Please refer her to Urology ASAP. If pain worsens, has fever, bloody urine, etc, seek medical care. ______

## 2016-02-24 NOTE — Telephone Encounter (Signed)
Addressed under results.

## 2016-02-26 NOTE — Progress Notes (Addendum)
Quick Note:  TSH is normal at 3.50. LFTs, CBC normal. CT already addressed, noting right kidney stone. This is likely culprit of RLQ pain. However, she has significant upper GI symptoms with refractory GERD, upper abdominal pain. Complete EGD as planned. Patient has been referred to Urology ASAP. No evidence of complicating urological features on CT.  ______

## 2016-02-27 ENCOUNTER — Other Ambulatory Visit: Payer: Self-pay

## 2016-02-27 DIAGNOSIS — N200 Calculus of kidney: Secondary | ICD-10-CM

## 2016-03-09 ENCOUNTER — Encounter (HOSPITAL_COMMUNITY)
Admission: RE | Disposition: A | Discharge status: Home / Self Care | Payer: Self-pay | Source: Ambulatory Visit | Attending: Internal Medicine

## 2016-03-09 ENCOUNTER — Ambulatory Visit (HOSPITAL_COMMUNITY)
Admission: RE | Admit: 2016-03-09 | Discharge: 2016-03-09 | Disposition: A | Discharge status: Home / Self Care | Payer: Commercial Managed Care - HMO | Source: Ambulatory Visit | Attending: Internal Medicine | Admitting: Internal Medicine

## 2016-03-09 ENCOUNTER — Encounter (HOSPITAL_COMMUNITY): Payer: Self-pay | Admitting: *Deleted

## 2016-03-09 DIAGNOSIS — E039 Hypothyroidism, unspecified: Secondary | ICD-10-CM | POA: Insufficient documentation

## 2016-03-09 DIAGNOSIS — R1010 Upper abdominal pain, unspecified: Secondary | ICD-10-CM | POA: Diagnosis not present

## 2016-03-09 DIAGNOSIS — F419 Anxiety disorder, unspecified: Secondary | ICD-10-CM | POA: Insufficient documentation

## 2016-03-09 DIAGNOSIS — Z79899 Other long term (current) drug therapy: Secondary | ICD-10-CM | POA: Insufficient documentation

## 2016-03-09 DIAGNOSIS — K921 Melena: Secondary | ICD-10-CM | POA: Diagnosis not present

## 2016-03-09 DIAGNOSIS — F319 Bipolar disorder, unspecified: Secondary | ICD-10-CM | POA: Insufficient documentation

## 2016-03-09 DIAGNOSIS — R1310 Dysphagia, unspecified: Secondary | ICD-10-CM | POA: Diagnosis not present

## 2016-03-09 DIAGNOSIS — F329 Major depressive disorder, single episode, unspecified: Secondary | ICD-10-CM | POA: Insufficient documentation

## 2016-03-09 DIAGNOSIS — K2210 Ulcer of esophagus without bleeding: Secondary | ICD-10-CM

## 2016-03-09 DIAGNOSIS — F17210 Nicotine dependence, cigarettes, uncomplicated: Secondary | ICD-10-CM | POA: Insufficient documentation

## 2016-03-09 DIAGNOSIS — K219 Gastro-esophageal reflux disease without esophagitis: Secondary | ICD-10-CM | POA: Diagnosis not present

## 2016-03-09 HISTORY — PX: ESOPHAGOGASTRODUODENOSCOPY: SHX5428

## 2016-03-09 SURGERY — EGD (ESOPHAGOGASTRODUODENOSCOPY)
Anesthesia: Moderate Sedation

## 2016-03-09 MED ORDER — MIDAZOLAM HCL 5 MG/5ML IJ SOLN
INTRAMUSCULAR | Status: AC
  Filled 2016-03-09: qty 10

## 2016-03-09 MED ORDER — ONDANSETRON HCL 4 MG/2ML IJ SOLN
INTRAMUSCULAR | Status: DC | PRN
Start: 2016-03-09 — End: 2016-03-09
  Administered 2016-03-09: 4 mg via INTRAVENOUS

## 2016-03-09 MED ORDER — STERILE WATER FOR IRRIGATION IR SOLN
Status: DC | PRN
  Administered 2016-03-09: 08:00:00

## 2016-03-09 MED ORDER — SODIUM CHLORIDE 0.9 % IV SOLN
INTRAVENOUS | Status: DC
  Administered 2016-03-09: 07:00:00 via INTRAVENOUS

## 2016-03-09 MED ORDER — MEPERIDINE HCL 100 MG/ML IJ SOLN
INTRAMUSCULAR | Status: AC
  Filled 2016-03-09: qty 2

## 2016-03-09 MED ORDER — MIDAZOLAM HCL 5 MG/5ML IJ SOLN
INTRAMUSCULAR | Status: DC | PRN
  Administered 2016-03-09 (×2): 2 mg via INTRAVENOUS

## 2016-03-09 MED ORDER — LIDOCAINE VISCOUS 2 % MT SOLN
OROMUCOSAL | Status: AC
  Filled 2016-03-09: qty 15

## 2016-03-09 MED ORDER — PROMETHAZINE HCL 25 MG/ML IJ SOLN
25.0000 mg | Freq: Once | INTRAMUSCULAR | Status: AC
  Administered 2016-03-09: 25 mg via INTRAVENOUS

## 2016-03-09 MED ORDER — MEPERIDINE HCL 100 MG/ML IJ SOLN
INTRAMUSCULAR | Status: DC | PRN
  Administered 2016-03-09 (×2): 25 mg via INTRAVENOUS

## 2016-03-09 MED ORDER — LIDOCAINE VISCOUS 2 % MT SOLN
OROMUCOSAL | Status: DC | PRN
  Administered 2016-03-09: 5 mL via OROMUCOSAL

## 2016-03-09 MED ORDER — SODIUM CHLORIDE 0.9% FLUSH
INTRAVENOUS | Status: AC
  Filled 2016-03-09: qty 10

## 2016-03-09 MED ORDER — PROMETHAZINE HCL 25 MG/ML IJ SOLN
INTRAMUSCULAR | Status: AC
  Filled 2016-03-09: qty 1

## 2016-03-09 MED ORDER — ONDANSETRON HCL 4 MG/2ML IJ SOLN
INTRAMUSCULAR | Status: AC
  Filled 2016-03-09: qty 2

## 2016-03-09 NOTE — H&P (View-Only) (Signed)
Referring Provider: Redmond School, MD Primary Care Physician:  Glo Herring., MD Primary GI: Dr. Gala Romney   Chief Complaint  Patient presents with  . Weight Gain  . diverticulitis    HPI:   Whitney Lopez is a 55 y.o. female presenting today with a history of GERD, dysphagia, last EGD in Nov 2012 with erosive reflux esophagitis, s/p empiric dilation. Colonoscopy up-to-date and due again in 2022.    Has gotten up at night twice vomiting, Sometimes during the day. This occurred while taking antibiotics. No more vomiting since coming off antibiotics. Like acid. Eats chili made out of bison and elk, uses diced tomatoes. Still nauseated now. Refractory GERD. Takes Aleve pm every day for hip.   Chronic abdominal pain, upper abdomen and RLQ. Taking 2 pills of Dexilant every day. Taking Tums. Eats and belches. Heartburn so bad the other day that she got sick. Water makes her feel sick.   Upper abdominal pain, worsened by foods at times. More pain located in lower abdomen, constant. Noted as a dull ache. Woke up one evening with severe pain in lower abdomen. Paper hematochezia. Hard stools. BM once every 3-4 days, sometimes 5. No fever/chills.   One time on fork lift, got dizzy while in pain.   Past Medical History  Diagnosis Date  . Bipolar disorder ([place])   . Lower extremity edema   . Hypothyroidism   . Headache(784.0)   . Depression   . Anxiety   . GERD (gastroesophageal reflux disease)     Erosive reflux esophagitis    Past Surgical History  Procedure Laterality Date  . S/p hysterectomy    . Clavicle repair    . Cholecystectomy    . Esophagogastroduodenoscopy  11/02/2011    RMR: Four-quadrant erosive reflux esophagitis, gastric and bulbar erosions, H. pylori negative  . Maloney dilation  11/02/2011    35F  . Colonoscopy N/A 09/28/2014    DR. Rourk: 1. pancolonic diverticulosis. Rectal and colonic polyps removed, one tubular adenoma descending colon, otherwise  hyperplastic. Suveillance in 2022    Current Outpatient Prescriptions  Medication Sig Dispense Refill  . alprazolam (XANAX) 2 MG tablet Take 2 mg by mouth 3 (three) times daily.  1  . dexlansoprazole (DEXILANT) 60 MG capsule Take 1 capsule (60 mg total) by mouth daily. 30 capsule 5  . levothyroxine (SYNTHROID, LEVOTHROID) 175 MCG tablet Take 200 mcg by mouth daily.     . pantoprazole (PROTONIX) 40 MG tablet Take 1 tablet (40 mg total) by mouth 2 (two) times daily before a meal. 60 tablet 3   No current facility-administered medications for this visit.    Allergies as of 02/16/2016 - Review Complete 02/16/2016  Allergen Reaction Noted  . Penicillins Hives 10/11/2011    Family History  Problem Relation Age of Onset  . Colon cancer Neg Hx   . Ulcers Mother     Social History   Social History  . Marital Status: Married    Spouse Name: N/A  . Number of Children: 0  . Years of Education: N/A   Occupational History  .  [employer]   Social History Main Topics  . Smoking status: Current Some Day Smoker -- 1.00 packs/day for 34 years    Types: Cigarettes  . Smokeless tobacco: None  . Alcohol Use: No  . Drug Use: No  . Sexual Activity: Not Asked   Other Topics Concern  . None   Social History Narrative    Review of  Systems: As mentioned in HPI.   Physical Exam: BP 135/83 mmHg  Pulse 72  Temp(Src) 98.2 F (36.8 C)  Ht 5\' 9"  (1.753 m)  Wt 240 lb 12.8 oz (109.226 kg)  BMI 35.54 kg/m2 General:   Alert and oriented. No distress noted. Pleasant and cooperative.  Head:  Normocephalic and atraumatic. Eyes:  Conjuctiva clear without scleral icterus. Mouth:  Oral mucosa pink and moist. Good dentition. No lesions. Heart:  S1, S2 present without murmurs, rubs, or gallops. Regular rate and rhythm. Abdomen:  +BS, soft, TTP upper abdomen and questionable ventral hernia. Obese. Non-distended. No rebound or guarding.  Msk:  Symmetrical without gross deformities. Normal  posture. Extremities:  Chronic 2-3+ lower extremity pedal and ankle edema  Neurologic:  Alert and  oriented x4;  grossly normal neurologically. Psych:  Alert and cooperative. Normal mood and affect.

## 2016-03-09 NOTE — Discharge Instructions (Signed)
EGD Discharge instructions Please read the instructions outlined below and refer to this sheet in the next few weeks. These discharge instructions provide you with general information on caring for yourself after you leave the hospital. Your doctor may also give you specific instructions. While your treatment has been planned according to the most current medical practices available, unavoidable complications occasionally occur. If you have any problems or questions after discharge, please call your doctor. ACTIVITY  You may resume your regular activity but move at a slower pace for the next 24 hours.   Take frequent rest periods for the next 24 hours.   Walking will help expel (get rid of) the air and reduce the bloated feeling in your abdomen.   No driving for 24 hours (because of the anesthesia (medicine) used during the test).   You may shower.   Do not sign any important legal documents or operate any machinery for 24 hours (because of the anesthesia used during the test).  NUTRITION  Drink plenty of fluids.   You may resume your normal diet.   Begin with a light meal and progress to your normal diet.   Avoid alcoholic beverages for 24 hours or as instructed by your caregiver.  MEDICATIONS  You may resume your normal medications unless your caregiver tells you otherwise.  WHAT YOU CAN EXPECT TODAY  You may experience abdominal discomfort such as a feeling of fullness or gas pains.  FOLLOW-UP  Your doctor will discuss the results of your test with you.  SEEK IMMEDIATE MEDICAL ATTENTION IF ANY OF THE FOLLOWING OCCUR:  Excessive nausea (feeling sick to your stomach) and/or vomiting.   Severe abdominal pain and distention (swelling).   Trouble swallowing.   Temperature over 101 F (37.8 C).   Rectal bleeding or vomiting of blood.   GERD information provided  Continue Protonix 40 mg twice daily  Try losing weight recently gained  Office visit with [place] in 3  months  Gastroesophageal Reflux Disease, Adult Normally, food travels down the esophagus and stays in the stomach to be digested. However, when a person has gastroesophageal reflux disease (GERD), food and stomach acid move back up into the esophagus. When this happens, the esophagus becomes sore and inflamed. Over time, GERD can create small holes (ulcers) in the lining of the esophagus.  CAUSES This condition is caused by a problem with the muscle between the esophagus and the stomach (lower esophageal sphincter, or LES). Normally, the LES muscle closes after food passes through the esophagus to the stomach. When the LES is weakened or abnormal, it does not close properly, and that allows food and stomach acid to go back up into the esophagus. The LES can be weakened by certain dietary substances, medicines, and medical conditions, including:  Tobacco use.  Pregnancy.  Having a hiatal hernia.  Heavy alcohol use.  Certain foods and beverages, such as coffee, chocolate, onions, and peppermint. RISK FACTORS This condition is more likely to develop in:  People who have an increased body weight.  People who have connective tissue disorders.  People who use NSAID medicines. SYMPTOMS Symptoms of this condition include:  Heartburn.  Difficult or painful swallowing.  The feeling of having a lump in the throat.  Abitter taste in the mouth.  Bad breath.  Having a large amount of saliva.  Having an upset or bloated stomach.  Belching.  Chest pain.  Shortness of breath or wheezing.  Ongoing (chronic) cough or a night-time cough.  Wearing away  of tooth enamel.  Weight loss. Different conditions can cause chest pain. Make sure to see your health care provider if you experience chest pain. DIAGNOSIS Your health care provider will take a medical history and perform a physical exam. To determine if you have mild or severe GERD, your health care provider may also monitor how you  respond to treatment. You may also have other tests, including:  An endoscopy toexamine your stomach and esophagus with a small camera.  A test thatmeasures the acidity level in your esophagus.  A test thatmeasures how much pressure is on your esophagus.  A barium swallow or modified barium swallow to show the shape, size, and functioning of your esophagus. TREATMENT The goal of treatment is to help relieve your symptoms and to prevent complications. Treatment for this condition may vary depending on how severe your symptoms are. Your health care provider may recommend:  Changes to your diet.  Medicine.  Surgery. HOME CARE INSTRUCTIONS Diet  Follow a diet as recommended by your health care provider. This may involve avoiding foods and drinks such as:  Coffee and tea (with or without caffeine).  Drinks that containalcohol.  Energy drinks and sports drinks.  Carbonated drinks or sodas.  Chocolate and cocoa.  Peppermint and mint flavorings.  Garlic and onions.  Horseradish.  Spicy and acidic foods, including peppers, chili powder, curry powder, vinegar, hot sauces, and barbecue sauce.  Citrus fruit juices and citrus fruits, such as oranges, lemons, and limes.  Tomato-based foods, such as red sauce, chili, salsa, and pizza with red sauce.  Fried and fatty foods, such as donuts, french fries, potato chips, and high-fat dressings.  High-fat meats, such as hot dogs and fatty cuts of red and white meats, such as rib eye steak, sausage, ham, and bacon.  High-fat dairy items, such as whole milk, butter, and cream cheese.  Eat small, frequent meals instead of large meals.  Avoid drinking large amounts of liquid with your meals.  Avoid eating meals during the 2-3 hours before bedtime.  Avoid lying down right after you eat.  Do not exercise right after you eat. General Instructions  Pay attention to any changes in your symptoms.  Take over-the-counter and  prescription medicines only as told by your health care provider. Do not take aspirin, ibuprofen, or other NSAIDs unless your health care provider told you to do so.  Do not use any tobacco products, including cigarettes, chewing tobacco, and e-cigarettes. If you need help quitting, ask your health care provider.  Wear loose-fitting clothing. Do not wear anything tight around your waist that causes pressure on your abdomen.  Raise (elevate) the head of your bed 6 inches (15cm).  Try to reduce your stress, such as with yoga or meditation. If you need help reducing stress, ask your health care provider.  If you are overweight, reduce your weight to an amount that is healthy for you. Ask your health care provider for guidance about a safe weight loss goal.  Keep all follow-up visits as told by your health care provider. This is important. SEEK MEDICAL CARE IF:  You have new symptoms.  You have unexplained weight loss.  You have difficulty swallowing, or it hurts to swallow.  You have wheezing or a persistent cough.  Your symptoms do not improve with treatment.  You have a hoarse voice. SEEK IMMEDIATE MEDICAL CARE IF:  You have pain in your arms, neck, jaw, teeth, or back.  You feel sweaty, dizzy, or light-headed.  You have chest pain or shortness of breath.  You vomit and your vomit looks like blood or coffee grounds.  You faint.  Your stool is bloody or black.  You cannot swallow, drink, or eat.   This information is not intended to replace advice given to you by your health care provider. Make sure you discuss any questions you have with your health care provider.   Document Released: 09/12/2005 Document Revised: 08/24/2015 Document Reviewed: 03/30/2015 Elsevier Interactive Patient Education [employer].

## 2016-03-09 NOTE — Op Note (Signed)
[facility] Patient Name: Whitney Lopez Procedure Date: 03/09/2016 7:27 AM MRN: [id number] Date of Birth: May 12, 1961 Attending MD: Norvel Richards , MD CSN: [id number] Age: 55 Admit Type: Outpatient Procedure:                Upper GI endoscopy Indications:              Heartburn -symptoms recently improved with going to                            Protonix 40 mg twice daily. Recent associated 40                            pound weight gain. Providers:                Norvel Richards, MD, Renda Rolls, RN, Isabella Stalling, Technician Referring MD:             Kerin Perna M.D. , MD (Referring MD) Medicines:                Midazolam 4 mg IV, Meperidine 50 mg IV, Ondansetron                            4 mg IV, Promethazine 25 mg IV Complications:            No immediate complications. Estimated Blood Loss:     Estimated blood loss: none. Procedure:                Pre-Anesthesia Assessment:                           - Prior to the procedure, a History and Physical                            was performed, and patient medications and                            allergies were reviewed. The patient's tolerance of                            previous anesthesia was also reviewed. The risks                            and benefits of the procedure and the sedation                            options and risks were discussed with the patient.                            All questions were answered, and informed consent                            was obtained. Prior Anticoagulants: The patient has  taken no previous anticoagulant or antiplatelet                            agents. ASA Grade Assessment: II - A patient with                            mild systemic disease. After reviewing the risks                            and benefits, the patient was deemed in                            satisfactory condition to undergo the procedure.                        After obtaining informed consent, the endoscope was                            passed under direct vision. Throughout the                            procedure, the patient's blood pressure, pulse, and                            oxygen saturations were monitored continuously. The                            [id number]) was introduced through the                            mouth, and advanced to the second part of duodenum.                            The upper GI endoscopy was accomplished without                            difficulty. The patient tolerated the procedure                            well. Scope In: 7:45:12 AM Scope Out: [id number] AM Total Procedure Duration: 0 hours 3 minutes 0 seconds  Findings:      The examined esophagus was normal. Estimated blood loss: none.      The entire examined stomach was normal.      The second portion of the duodenum was normal. Impression:               - Normal esophagus.                           - Normal stomach.                           - Normal second portion of the duodenum.                           -  No specimens collected. Moderate Sedation:      Moderate (conscious) sedation was administered by the endoscopy nurse       and supervised by the endoscopist. The following parameters were       monitored: oxygen saturation, heart rate, blood pressure, respiratory       rate, EKG, adequacy of pulmonary ventilation, and response to care.       Total physician intraservice time was 16 minutes. Recommendation:           - Patient has a contact number available for                            emergencies. The signs and symptoms of potential                            delayed complications were discussed with the                            patient. Return to normal activities tomorrow.                            Written discharge instructions were provided to the                            patient.                            - Advance diet as tolerated today.                           - Continue present medications. (including Protonix                            40 mg twice daily) : Patient is to attempt to lose                            the weight she is recently gained.                           - Return to GI office in 3 months. Procedure Code(s):        --- Professional ---                           [phone], Esophagogastroduodenoscopy, flexible,                            transoral; diagnostic, including collection of                            specimen(s) by brushing or washing, when performed                            (separate procedure)                           [id number], Moderate sedation services provided by the  same physician or other qualified health care                            professional performing the diagnostic or                            therapeutic service that the sedation supports,                            requiring the presence of an independent trained                            observer to assist in the monitoring of the                            patient's level of consciousness and physiological                            status; initial 15 minutes of intraservice time,                            patient age 82 years or older Diagnosis Code(s):        --- Professional ---                           R12, Heartburn CPT copyright 2016 American Medical Association. All rights reserved. The codes documented in this report are preliminary and upon coder review may  be revised to meet current compliance requirements. Cristopher Estimable. Devina Bezold, MD Norvel Richards, MD 03/09/2016 7:59:39 AM This report has been signed electronically. Number of Addenda: 0

## 2016-03-09 NOTE — Interval H&P Note (Signed)
History and Physical Interval Note:  03/09/2016 7:33 AM  Whitney Lopez  has presented today for surgery, with the diagnosis of erosive esophagitis  The various methods of treatment have been discussed with the patient and family. After consideration of risks, benefits and other options for treatment, the patient has consented to  Procedure(s) with comments: ESOPHAGOGASTRODUODENOSCOPY (EGD) (N/A) - 730 as a surgical intervention .  The patient's history has been reviewed, patient examined, no change in status, stable for surgery.  I have reviewed the patient's chart and labs.  Questions were answered to the patient's satisfaction.     Robert Rourk  No change;m EGD per plan. The risks, benefits, limitations, alternatives and imponderables have been reviewed with the patient. Potential for esophageal dilation, biopsy, etc. have also been reviewed.  Questions have been answered. All parties agreeable.

## 2016-03-14 ENCOUNTER — Ambulatory Visit (INDEPENDENT_AMBULATORY_CARE_PROVIDER_SITE_OTHER): Payer: Commercial Managed Care - HMO | Admitting: Urology

## 2016-03-14 ENCOUNTER — Encounter: Payer: Self-pay | Admitting: Urology

## 2016-03-14 VITALS — BP 112/74 | HR 84 | Ht 69.0 in | Wt 238.3 lb

## 2016-03-14 DIAGNOSIS — R32 Unspecified urinary incontinence: Secondary | ICD-10-CM

## 2016-03-14 DIAGNOSIS — R109 Unspecified abdominal pain: Secondary | ICD-10-CM | POA: Diagnosis not present

## 2016-03-14 DIAGNOSIS — N941 Unspecified dyspareunia: Secondary | ICD-10-CM | POA: Diagnosis not present

## 2016-03-14 DIAGNOSIS — N201 Calculus of ureter: Secondary | ICD-10-CM | POA: Diagnosis not present

## 2016-03-14 LAB — URINALYSIS, COMPLETE
Bilirubin, UA: NEGATIVE
Glucose, UA: NEGATIVE
KETONES UA: NEGATIVE
Leukocytes, UA: NEGATIVE
NITRITE UA: NEGATIVE
PH UA: 5.5 (ref 5.0–7.5)
Protein, UA: NEGATIVE
RBC UA: NEGATIVE
Urobilinogen, Ur: 0.2 mg/dL (ref 0.2–1.0)

## 2016-03-14 LAB — MICROSCOPIC EXAMINATION
BACTERIA UA: NONE SEEN
EPITHELIAL CELLS (NON RENAL): NONE SEEN /HPF (ref 0–10)
RBC MICROSCOPIC, UA: NONE SEEN /HPF (ref 0–?)
WBC, UA: NONE SEEN /hpf (ref 0–?)

## 2016-03-14 NOTE — Progress Notes (Signed)
03/14/2016 11:07 PM   Whitney Lopez [birth year] [id number]  Referring provider: Redmond School, MD [address] [place], [place] [address]  Chief Complaint  Patient presents with  . New Patient (Initial Visit)    Renal Stones     HPI: Patient is a 55 year old Caucasian female referred to [place] by Laban Emperor, NP, for kidney stones.  Patient states that for the last 2 years she is had a gnawing pain in her right flank area that has been intermittent.  The pain radiates into her hip and buttock.  She has had some associated nausea and loss of appetite with this pain.  She had recently been seen by GI and a CT of the abdomen and pelvis with contrast on 02/21/2016.  It noted mild dilation of the proximal right ureter with surrounding stranding with a 1 mm stone in the proximal right ureter.  Patient does have a prior history of nephrolithiasis.  She had stones removed ureteroscopically with placement of ureteral stent.  This was in 2000. She did not return for further follow-up.  She has baseline urinary symptoms of frequent urination, urgency, nocturia and urinary leakage.  She has been wearing pads for 3-4 years.  She also complains of dyspareunia.  She states this is been present since 03/17/2012.  She states when she is having intercourse it causes a cramping type of pain.    Her urinalysis today is negative.  I personally reviewed the films and have reviewed the films with the patient and with Dr. Erlene Quan.    PMH: Past Medical History  Diagnosis Date  . Bipolar disorder ([place])   . Lower extremity edema   . Hypothyroidism   . Headache(784.0)   . Depression   . Anxiety   . GERD (gastroesophageal reflux disease)     Erosive reflux esophagitis  . Constipation 02/16/2016  . Erosive esophagitis 11/30/2011  . Esophageal dysphagia 03/19/2012  . Gastroesophageal reflux disease without esophagitis   . Unspecified constipation 09/09/2014  . Abdominal bloating 01/20/2013  . Abdominal pain  02/16/2016  . Bipolar disorder, unspecified ([place]) 10/09/2012  . Chronic cough 01/20/2013  . Encounter for screening colonoscopy 09/09/2014  . Epigastric pain 10/11/2011    Surgical History: Past Surgical History  Procedure Laterality Date  . S/p hysterectomy    . Clavicle repair    . Cholecystectomy    . Esophagogastroduodenoscopy  11/02/2011    RMR: Four-quadrant erosive reflux esophagitis, gastric and bulbar erosions, H. pylori negative  . Maloney dilation  11/02/2011    15F  . Colonoscopy N/A 09/28/2014    DR. Rourk: 1. pancolonic diverticulosis. Rectal and colonic polyps removed, one tubular adenoma descending colon, otherwise hyperplastic. Suveillance in 2022  . Esophagogastroduodenoscopy N/A 03/09/2016    Procedure: ESOPHAGOGASTRODUODENOSCOPY (EGD);  Surgeon: Daneil Dolin, MD;  [facility]: [facility];  Service: Endoscopy;  Laterality: N/A;  730    Home Medications:    Medication List       This list is accurate as of: 03/14/16 11:59 PM.  Always use your most recent med list.               alprazolam 2 MG tablet  Commonly known as:  XANAX  Take 2 mg by mouth 3 (three) times daily.     dexlansoprazole 60 MG capsule  Commonly known as:  DEXILANT  Take 1 capsule (60 mg total) by mouth daily.     levothyroxine 175 MCG tablet  Commonly known as:  SYNTHROID, LEVOTHROID  Take 200 mcg by mouth daily.     SYNTHROID 150 MCG tablet  Generic drug:  levothyroxine  TAKE 1 TABLET BY MOUTH ON SATURDAY AND SUNDAY     pantoprazole 40 MG tablet  Commonly known as:  PROTONIX  Take 1 tablet (40 mg total) by mouth 2 (two) times daily before a meal.        Allergies:  Allergies  Allergen Reactions  . Penicillins Hives    Family History: Family History  Problem Relation Age of Onset  . Colon cancer Neg Hx   . Ulcers Mother     Social History:  reports that she has been smoking Cigarettes.  She has a 40 pack-year smoking history. She does not have any smokeless tobacco  history on file. She reports that she does not drink alcohol or use illicit drugs.  ROS: UROLOGY Frequent Urination?: Yes Hard to postpone urination?: Yes Burning/pain with urination?: No Get up at night to urinate?: Yes Leakage of urine?: Yes Urine stream starts and stops?: No Trouble starting stream?: No Do you have to strain to urinate?: No Blood in urine?: No Urinary tract infection?: No Sexually transmitted disease?: No Injury to kidneys or bladder?: No Painful intercourse?: Yes Weak stream?: No Currently pregnant?: No Vaginal bleeding?: No Last menstrual period?: hysterectomy  Gastrointestinal Nausea?: Yes Vomiting?: No Indigestion/heartburn?: Yes Diarrhea?: No Constipation?: Yes  Constitutional Fever: No Night sweats?: Yes Weight loss?: No Fatigue?: Yes  Skin Skin rash/lesions?: No Itching?: Yes  Eyes Blurred vision?: No Double vision?: No  Ears/Nose/Throat Sore throat?: No Sinus problems?: No  Hematologic/Lymphatic Swollen glands?: No Easy bruising?: No  Cardiovascular Leg swelling?: Yes Chest pain?: No  Respiratory Cough?: No Shortness of breath?: No  Endocrine Excessive thirst?: No  Musculoskeletal Back pain?: Yes Joint pain?: No  Neurological Headaches?: No Dizziness?: Yes  Psychologic Depression?: No Anxiety?: Yes  Physical Exam: BP 112/74 mmHg  Pulse 84  Ht 5\' 9"  (1.753 m)  Wt 238 lb 4.8 oz (108.092 kg)  BMI 35.17 kg/m2  Constitutional: Well nourished. Alert and oriented, No acute distress. HEENT: [place] AT, moist mucus membranes. Trachea midline, no masses. Cardiovascular: No clubbing, cyanosis, or edema. Respiratory: Normal respiratory effort, no increased work of breathing. GI: Abdomen is soft, non tender, non distended, no abdominal masses. Liver and spleen not palpable.  No hernias appreciated.  Stool sample for occult testing is not indicated.   GU: No CVA tenderness.  No bladder fullness or masses.   Skin: No  rashes, bruises or suspicious lesions. Lymph: No cervical or inguinal adenopathy. Neurologic: Grossly intact, no focal deficits, moving all 4 extremities. Psychiatric: Normal mood and affect.  Laboratory Data: Lab Results  Component Value Date   WBC 10.3 02/16/2016   HGB 13.6 02/16/2016   HCT 41.4 02/16/2016   MCV 92.6 02/16/2016   PLT 231 02/16/2016    Lab Results  Component Value Date   CREATININE 0.65 02/16/2016    Lab Results  Component Value Date   HGBA1C 6.0* 08/16/2014    Lab Results  Component Value Date   TSH 3.50 02/16/2016    Lab Results  Component Value Date   AST 18 02/16/2016   Lab Results  Component Value Date   ALT 23 02/16/2016     Urinalysis Results for orders placed or performed in visit on 03/14/16  CULTURE, URINE COMPREHENSIVE  Result Value Ref Range   Urine Culture, Comprehensive Final report    Result 1 Comment   Microscopic Examination  Result Value  Ref Range   WBC, UA None seen 0 -  5 /hpf   RBC, UA None seen 0 -  2 /hpf   Epithelial Cells (non renal) None seen 0 - 10 /hpf   Bacteria, UA None seen None seen/Few  Urinalysis, Complete  Result Value Ref Range   Specific Gravity, UA <1.005 (L) 1.005 - 1.030   pH, UA 5.5 5.0 - 7.5   Color, UA Yellow Yellow   Appearance Ur Clear Clear   Leukocytes, UA Negative Negative   Protein, UA Negative Negative/Trace   Glucose, UA Negative Negative   Ketones, UA Negative Negative   RBC, UA Negative Negative   Bilirubin, UA Negative Negative   Urobilinogen, Ur 0.2 0.2 - 1.0 mg/dL   Nitrite, UA Negative Negative   Microscopic Examination See below:     Pertinent Imaging: CLINICAL DATA: Right side abdomen pain for 2 years. Concern for ventral hernia.  EXAM: CT ABDOMEN AND PELVIS WITH CONTRAST  TECHNIQUE: Multidetector CT imaging of the abdomen and pelvis was performed using the standard protocol following bolus administration of intravenous contrast.  CONTRAST: 141mL OMNIPAQUE  IOHEXOL 300 MG/ML SOLN  COMPARISON: January 21, 2013  FINDINGS: Lower chest: No acute findings.  Hepatobiliary: Patient is status post prior cholecystectomy. There is mild diffuse low density of liver without vessel displacement. No masses or other significant abnormality.  Pancreas: No mass, inflammatory changes, or other significant abnormality.  Spleen: Within normal limits in size. There are calcified granulomas within the spleen.  Adrenals/Urinary Tract: There is mild dilatation with surrounding stranding of the proximal right ureter with a 1 mm stone in the proximal right ureter best seen on image 53. The left kidney is normal. There is no left hydronephrosis. The adrenal glands are normal.  Stomach/Bowel: No evidence of obstruction, inflammatory process, or abnormal fluid collections. There is diverticulosis of colon without diverticulitis. The appendix is not seen but no inflammation is noted around the cecum.  Vascular/Lymphatic: No pathologically enlarged lymph nodes. No evidence of abdominal aortic aneurysm.  Reproductive: Patient status post prior hysterectomy. There is a small cyst in normal size right ovary.  Other: There is no evidence of ventral hernia.  Musculoskeletal: No suspicious bone lesions identified.  IMPRESSION: No evidence of ventral hernia.  Mild dilatation of the proximal right ureter with surrounding stranding with a 1 mm stone in the proximal right ureter. The stone is probably partial obstructing.   Electronically Signed  By: Abelardo Diesel M.D.  On: 02/21/2016 09:45  Assessment & Plan:    1. Right ureteral stone:   Patient's stone is not causing hydronephrosis on the CT scan.  It is unlikely that this is the source of her pain, that she has been experiencing the right-sided gnawing sensation for 2 years.   The mean passage rate for stones less than 2 mm in size in 7 days.  She will most likely pass the stone  spontaneously within a week.  - Urinalysis, Complete - CULTURE, URINE COMPREHENSIVE  2. Right sided abdominal pain:   Patient's EGD results are still pending at this time.  The pains radiation pattern seems to suggest a possible musculoskeletal origin.  Her husband has had back surgery in the past and has seen a Suella Broad, MD in [place].  She would like to be referred to him for further evaluation of her pain.    3. Incontinence:   Once the etiology of her right-sided abdominal pain is determined, she would like to return for a  follow-up appointment for further workup of her urinary incontinence.  4. Dyspareunia:   Once the etiology of her right-sided abdominal pain is determined, she would like to return for a follow-up appointment for further workup of her dyspareunia.   Return for follow up after seeing Dr. Nelva Bush.  These notes generated with voice recognition software. I apologize for typographical errors.  Zara Council, [facility] [address] [place], [place] [address] [phone]

## 2016-03-16 LAB — CULTURE, URINE COMPREHENSIVE

## 2016-03-17 ENCOUNTER — Telehealth: Payer: Self-pay | Admitting: Urology

## 2016-03-17 DIAGNOSIS — N201 Calculus of ureter: Secondary | ICD-10-CM | POA: Insufficient documentation

## 2016-03-17 DIAGNOSIS — N941 Unspecified dyspareunia: Secondary | ICD-10-CM | POA: Insufficient documentation

## 2016-03-17 NOTE — Telephone Encounter (Signed)
Would you send a copy of my office note from 03/14/2016 to Laban Emperor, NP?

## 2016-03-19 NOTE — Telephone Encounter (Signed)
done

## 2016-06-12 ENCOUNTER — Ambulatory Visit: Payer: Self-pay | Admitting: Nurse Practitioner

## 2016-07-03 ENCOUNTER — Encounter: Payer: Self-pay | Admitting: Nurse Practitioner

## 2016-07-19 ENCOUNTER — Ambulatory Visit: Payer: Self-pay | Admitting: Nurse Practitioner

## 2016-08-06 ENCOUNTER — Ambulatory Visit: Payer: Self-pay | Admitting: Nurse Practitioner

## 2016-08-06 ENCOUNTER — Encounter: Payer: Self-pay | Admitting: Nurse Practitioner

## 2016-08-06 ENCOUNTER — Telehealth: Payer: Self-pay | Admitting: Nurse Practitioner

## 2016-08-06 NOTE — Telephone Encounter (Signed)
PT WAS A NO SHOW AND LETTER SENT  °

## 2016-08-06 NOTE — Telephone Encounter (Signed)
Noted  

## 2017-01-31 DIAGNOSIS — R202 Paresthesia of skin: Secondary | ICD-10-CM | POA: Diagnosis not present

## 2017-01-31 DIAGNOSIS — R200 Anesthesia of skin: Secondary | ICD-10-CM | POA: Diagnosis not present

## 2017-02-11 DIAGNOSIS — R200 Anesthesia of skin: Secondary | ICD-10-CM | POA: Diagnosis not present

## 2017-02-11 DIAGNOSIS — R202 Paresthesia of skin: Secondary | ICD-10-CM | POA: Diagnosis not present

## 2017-02-22 DIAGNOSIS — G5601 Carpal tunnel syndrome, right upper limb: Secondary | ICD-10-CM | POA: Diagnosis not present

## 2017-02-22 DIAGNOSIS — R202 Paresthesia of skin: Secondary | ICD-10-CM | POA: Diagnosis not present

## 2017-02-22 DIAGNOSIS — R200 Anesthesia of skin: Secondary | ICD-10-CM | POA: Diagnosis not present

## 2017-02-25 DIAGNOSIS — R202 Paresthesia of skin: Secondary | ICD-10-CM | POA: Diagnosis not present

## 2017-02-25 DIAGNOSIS — R200 Anesthesia of skin: Secondary | ICD-10-CM | POA: Diagnosis not present

## 2017-06-03 DIAGNOSIS — E890 Postprocedural hypothyroidism: Secondary | ICD-10-CM | POA: Diagnosis not present

## 2017-06-10 DIAGNOSIS — E890 Postprocedural hypothyroidism: Secondary | ICD-10-CM | POA: Diagnosis not present

## 2017-10-21 DIAGNOSIS — H6120 Impacted cerumen, unspecified ear: Secondary | ICD-10-CM | POA: Diagnosis not present

## 2018-01-02 DIAGNOSIS — J069 Acute upper respiratory infection, unspecified: Secondary | ICD-10-CM | POA: Diagnosis not present

## 2018-01-10 DIAGNOSIS — J069 Acute upper respiratory infection, unspecified: Secondary | ICD-10-CM | POA: Diagnosis not present

## 2018-01-26 ENCOUNTER — Other Ambulatory Visit: Payer: Self-pay

## 2018-01-26 ENCOUNTER — Encounter (HOSPITAL_COMMUNITY): Payer: Self-pay | Admitting: Emergency Medicine

## 2018-01-26 ENCOUNTER — Emergency Department (HOSPITAL_COMMUNITY)
Admission: EM | Admit: 2018-01-26 | Discharge: 2018-01-27 | Disposition: A | Discharge status: Home / Self Care | Payer: 59 | Attending: Emergency Medicine | Admitting: Emergency Medicine

## 2018-01-26 ENCOUNTER — Emergency Department (HOSPITAL_COMMUNITY): Payer: 59

## 2018-01-26 DIAGNOSIS — F17210 Nicotine dependence, cigarettes, uncomplicated: Secondary | ICD-10-CM | POA: Insufficient documentation

## 2018-01-26 DIAGNOSIS — F319 Bipolar disorder, unspecified: Secondary | ICD-10-CM | POA: Insufficient documentation

## 2018-01-26 DIAGNOSIS — E039 Hypothyroidism, unspecified: Secondary | ICD-10-CM | POA: Insufficient documentation

## 2018-01-26 DIAGNOSIS — R109 Unspecified abdominal pain: Secondary | ICD-10-CM | POA: Diagnosis not present

## 2018-01-26 DIAGNOSIS — K5730 Diverticulosis of large intestine without perforation or abscess without bleeding: Secondary | ICD-10-CM | POA: Diagnosis not present

## 2018-01-26 DIAGNOSIS — N200 Calculus of kidney: Secondary | ICD-10-CM | POA: Diagnosis not present

## 2018-01-26 DIAGNOSIS — K5732 Diverticulitis of large intestine without perforation or abscess without bleeding: Secondary | ICD-10-CM | POA: Diagnosis not present

## 2018-01-26 DIAGNOSIS — K5792 Diverticulitis of intestine, part unspecified, without perforation or abscess without bleeding: Secondary | ICD-10-CM

## 2018-01-26 DIAGNOSIS — R1111 Vomiting without nausea: Secondary | ICD-10-CM | POA: Diagnosis not present

## 2018-01-26 DIAGNOSIS — Z79899 Other long term (current) drug therapy: Secondary | ICD-10-CM | POA: Diagnosis not present

## 2018-01-26 LAB — URINALYSIS, ROUTINE W REFLEX MICROSCOPIC
BACTERIA UA: NONE SEEN
BILIRUBIN URINE: NEGATIVE
Glucose, UA: NEGATIVE mg/dL
KETONES UR: NEGATIVE mg/dL
LEUKOCYTES UA: NEGATIVE
NITRITE: NEGATIVE
PH: 5 (ref 5.0–8.0)
Protein, ur: NEGATIVE mg/dL
SPECIFIC GRAVITY, URINE: 1.018 (ref 1.005–1.030)

## 2018-01-26 LAB — COMPREHENSIVE METABOLIC PANEL
ALT: 11 U/L — ABNORMAL LOW (ref 14–54)
AST: 13 U/L — AB (ref 15–41)
Albumin: 3.9 g/dL (ref 3.5–5.0)
Alkaline Phosphatase: 94 U/L (ref 38–126)
Anion gap: 9 (ref 5–15)
BILIRUBIN TOTAL: 0.5 mg/dL (ref 0.3–1.2)
BUN: 12 mg/dL (ref 6–20)
CO2: 26 mmol/L (ref 22–32)
Calcium: 9.2 mg/dL (ref 8.9–10.3)
Chloride: 103 mmol/L (ref 101–111)
Creatinine, Ser: 0.72 mg/dL (ref 0.44–1.00)
Glucose, Bld: 100 mg/dL — ABNORMAL HIGH (ref 65–99)
POTASSIUM: 4.2 mmol/L (ref 3.5–5.1)
Sodium: 138 mmol/L (ref 135–145)
TOTAL PROTEIN: 7.3 g/dL (ref 6.5–8.1)

## 2018-01-26 LAB — CBC
HEMATOCRIT: 44.2 % (ref 36.0–46.0)
Hemoglobin: 13.9 g/dL (ref 12.0–15.0)
MCH: 29.6 pg (ref 26.0–34.0)
MCHC: 31.4 g/dL (ref 30.0–36.0)
MCV: 94 fL (ref 78.0–100.0)
PLATELETS: 248 10*3/uL (ref 150–400)
RBC: 4.7 MIL/uL (ref 3.87–5.11)
RDW: 13.5 % (ref 11.5–15.5)
WBC: 8.2 10*3/uL (ref 4.0–10.5)

## 2018-01-26 LAB — LIPASE, BLOOD: LIPASE: 26 U/L (ref 11–51)

## 2018-01-26 LAB — PREGNANCY, URINE: Preg Test, Ur: NEGATIVE

## 2018-01-26 MED ORDER — KETOROLAC TROMETHAMINE 30 MG/ML IJ SOLN
15.0000 mg | Freq: Once | INTRAMUSCULAR | Status: AC
  Administered 2018-01-26: 15 mg via INTRAMUSCULAR
  Filled 2018-01-26: qty 1

## 2018-01-26 MED ORDER — METRONIDAZOLE 500 MG PO TABS
500.0000 mg | ORAL_TABLET | Freq: Once | ORAL | Status: AC
  Administered 2018-01-26: 500 mg via ORAL
  Filled 2018-01-26: qty 1

## 2018-01-26 MED ORDER — CIPROFLOXACIN HCL 250 MG PO TABS
500.0000 mg | ORAL_TABLET | Freq: Once | ORAL | Status: AC
  Administered 2018-01-26: 500 mg via ORAL
  Filled 2018-01-26: qty 2

## 2018-01-26 MED ORDER — CIPROFLOXACIN HCL 500 MG PO TABS: 500.0000 mg | ORAL_TABLET | Freq: Two times a day (BID) | ORAL | 0 refills | Status: DC

## 2018-01-26 MED ORDER — METRONIDAZOLE 500 MG PO TABS: 500.0000 mg | ORAL_TABLET | Freq: Two times a day (BID) | ORAL | 0 refills | Status: DC

## 2018-01-26 MED ORDER — SENNOSIDES-DOCUSATE SODIUM 8.6-50 MG PO TABS: 2.0000 | ORAL_TABLET | Freq: Every day | ORAL | 0 refills | Status: AC

## 2018-01-26 NOTE — ED Triage Notes (Signed)
Patient c/o left abd pain x3. Patient states nausea. Denies any vomiting, fevers, or diarrhea. Per patient has not had BM in 2 days. Patient also reports urgency to urinate with only little amounts of urine at a time.

## 2018-01-26 NOTE — ED Provider Notes (Signed)
[facility] EMERGENCY DEPARTMENT Provider Note   CSN: [id number] Arrival date & time: 01/26/18  1038     History   Chief Complaint Chief Complaint  Patient presents with  . Abdominal Pain    HPI Whitney Lopez is a 57 y.o. female.  HPI  Patient presents with concern of left-sided abdominal pain, change in bowel movements and urinary patterns. Onset was about 2 days ago, since onset symptoms been persistent, with mild left-sided abdominal pain that is worse with lying in the left lateral decubitus position. Pain is sore, severe, sharp. Pain is nonradiating. Bowel movements have diminished, and patient has had difficulty initiating a urinary stream during this illness. No fever, there is nausea and vomiting. There is anorexia, however. Patient has no history of diverticulitis, does have a history of kidney stone. She is here with her husband who assists with the HPI.  Past Medical History:  Diagnosis Date  . Abdominal bloating 01/20/2013  . Abdominal pain 02/16/2016  . Anxiety   . Bipolar disorder ([place])   . Bipolar disorder, unspecified ([place]) 10/09/2012  . Chronic cough 01/20/2013  . Constipation 02/16/2016  . Depression   . Encounter for screening colonoscopy 09/09/2014  . Epigastric pain 10/11/2011  . Erosive esophagitis 11/30/2011  . Esophageal dysphagia 03/19/2012  . Gastroesophageal reflux disease without esophagitis   . GERD (gastroesophageal reflux disease)    Erosive reflux esophagitis  . Headache(784.0)   . Hypothyroidism   . Lower extremity edema   . Unspecified constipation 09/09/2014    Patient Active Problem List   Diagnosis Date Noted  . Right ureteral stone 03/17/2016  . Dyspareunia in female 03/17/2016  . Gastroesophageal reflux disease without esophagitis   . Abdominal pain 02/16/2016  . Constipation 02/16/2016  . Unspecified constipation 09/09/2014  . Solitary pulmonary nodule 09/09/2014  . Encounter for screening colonoscopy 09/09/2014  . Abdominal  bloating 01/20/2013  . Chronic cough 01/20/2013  . Bipolar disorder, unspecified ([place]) 10/09/2012  . Esophageal dysphagia 03/19/2012  . Erosive esophagitis 11/30/2011  . Epigastric pain 10/11/2011    Past Surgical History:  Procedure Laterality Date  . CHOLECYSTECTOMY    . clavicle repair    . COLONOSCOPY N/A 09/28/2014   DR. Rourk: 1. pancolonic diverticulosis. Rectal and colonic polyps removed, one tubular adenoma descending colon, otherwise hyperplastic. Suveillance in 2022  . ESOPHAGOGASTRODUODENOSCOPY  11/02/2011   RMR: Four-quadrant erosive reflux esophagitis, gastric and bulbar erosions, H. pylori negative  . ESOPHAGOGASTRODUODENOSCOPY N/A 03/09/2016   Procedure: ESOPHAGOGASTRODUODENOSCOPY (EGD);  Surgeon: Daneil Dolin, MD;  Location: [facility];  Service: Endoscopy;  Laterality: N/A;  730  . MALONEY DILATION  11/02/2011   70F  . S/P Hysterectomy      OB History    No data available       Home Medications    Prior to Admission medications   Medication Sig Start Date End Date Taking? Authorizing Provider  levothyroxine (SYNTHROID, LEVOTHROID) 175 MCG tablet Take 200 mcg by mouth daily.    Yes [provider]  Nutritional Supplements (ESTROVEN PO) Take 1 tablet by mouth daily.   Yes [provider]  ciprofloxacin (CIPRO) 500 MG tablet Take 1 tablet (500 mg total) by mouth 2 (two) times daily. 01/26/18   Carmin Muskrat, MD  dexlansoprazole (DEXILANT) 60 MG capsule Take 1 capsule (60 mg total) by mouth daily. Patient not taking: Reported on 03/14/2016 11/02/15   Carlis Stable, NP  metroNIDAZOLE (FLAGYL) 500 MG tablet Take 1 tablet (500 mg  total) by mouth 2 (two) times daily. 01/26/18   Carmin Muskrat, MD  senna-docusate (SENOKOT-S) 8.6-50 MG tablet Take 2 tablets by mouth daily for 10 days. 01/26/18 02/05/18  Carmin Muskrat, MD  SYNTHROID 150 MCG tablet TAKE 1 TABLET BY MOUTH ON SATURDAY AND SUNDAY 03/03/16   [provider]    Family  History Family History  Problem Relation Age of Onset  . Ulcers Mother   . Colon cancer Neg Hx     Social History Social History   Tobacco Use  . Smoking status: Current Every Day Smoker    Packs/day: 1.00    Years: 40.00    Pack years: 40.00    Types: Cigarettes  . Smokeless tobacco: Never Used  Substance Use Topics  . Alcohol use: No  . Drug use: No     Allergies   Penicillins   Review of Systems Review of Systems  Constitutional:       Per HPI, otherwise negative  HENT:       Per HPI, otherwise negative  Respiratory:       Per HPI, otherwise negative  Cardiovascular:       Per HPI, otherwise negative  Gastrointestinal: Positive for nausea and vomiting.  Endocrine:       Negative aside from HPI  Genitourinary:       Neg aside from HPI   Musculoskeletal:       Per HPI, otherwise negative  Skin: Negative.   Neurological: Negative for syncope.     Physical Exam Updated Vital Signs BP 107/78 (BP Location: Left Arm)   Pulse 65   Temp 98.4 F (36.9 C) (Oral)   Resp 18   Ht 5\' 9"  (1.753 m)   Wt 90.3 kg (199 lb)   SpO2 97%   BMI 29.39 kg/m   Physical Exam  Constitutional: She is oriented to person, place, and time. She appears well-developed and well-nourished. No distress.  HENT:  Head: Normocephalic and atraumatic.  Eyes: Conjunctivae and EOM are normal.  Cardiovascular: Normal rate and regular rhythm.  Pulmonary/Chest: Effort normal and breath sounds normal. No stridor. No respiratory distress.  Abdominal: She exhibits no distension.  Tenderness about the left lateral abdomen, superiorly, otherwise unremarkable  Musculoskeletal: She exhibits no edema.  Neurological: She is alert and oriented to person, place, and time. No cranial nerve deficit.  Skin: Skin is warm and dry.  Psychiatric: She has a normal mood and affect.  Nursing note and vitals reviewed.    ED Treatments / Results  Labs (all labs ordered are listed, but only abnormal  results are displayed) Labs Reviewed  COMPREHENSIVE METABOLIC PANEL - Abnormal; Notable for the following components:      Result Value   Glucose, Bld 100 (*)    AST 13 (*)    ALT 11 (*)    All other components within normal limits  URINALYSIS, ROUTINE W REFLEX MICROSCOPIC - Abnormal; Notable for the following components:   APPearance HAZY (*)    Hgb urine dipstick MODERATE (*)    Squamous Epithelial / LPF 0-5 (*)    All other components within normal limits  LIPASE, BLOOD  CBC  PREGNANCY, URINE    EKG  EKG Interpretation None       Radiology Ct Renal Stone Study  Result Date: 01/26/2018 CLINICAL DATA:  Patient c/o left abd pain x3. Patient states nausea. Denies any vomiting, fevers, or diarrhea. Per patient has not had BM in 2 days. Patient also reports urgency  to urinate with only little amounts of urine at a time EXAM: CT ABDOMEN AND PELVIS WITHOUT CONTRAST TECHNIQUE: Multidetector CT imaging of the abdomen and pelvis was performed following the standard protocol without IV contrast. COMPARISON:  02/21/2016 FINDINGS: Lower chest: No acute abnormality. Hepatobiliary: No focal liver abnormality is seen. Status post cholecystectomy. No biliary dilatation. Pancreas: Unremarkable. No pancreatic ductal dilatation or surrounding inflammatory changes. Spleen: Multiple splenic calcifications consistent with healed granuloma. Spleen normal in size. No masses. Adrenals/Urinary Tract: No adrenal masses. Kidneys normal in size, orientation and position. No renal masses. There are small bilateral nonobstructing stones. No hydronephrosis. Ureters normal in course and in caliber. Bladder is unremarkable. Stomach/Bowel: There is mild inflammation adjacent to a diverticulum along the posterior aspect of the mid descending colon. There is no extraluminal air. There is no fluid collection to suggest an abscess. There are scattered diverticula elsewhere along the left colon, mostly along the sigmoid, with  no other inflammatory changes. Remainder of the colon is unremarkable. Normal appendix is visualized. Stomach and small bowel are within normal limits. Vascular/Lymphatic: No vascular abnormality. There are prominent inguinal lymph nodes, larger on the left. Largest is 13 mm in short axis. No other adenopathy. Reproductive: Status post hysterectomy. No adnexal masses. Other: There is a small skin lesion in the left inguinal region with adjacent inflammation, new since the prior CT. No abdominal wall hernia. No ascites. Musculoskeletal: No fracture or acute finding. No osteoblastic or osteolytic lesions. IMPRESSION: 1. Mild uncomplicated diverticulitis involving a single diverticulum along the posterior mid descending colon. No extraluminal air. No abscess. 2. Small skin lesion in the left inguinal region with associated subcutaneous fat inflammation. There are prominent inguinal lymph nodes, larger on the left, presumed reactive. 3. No other acute abnormalities within the abdomen or pelvis. 4. Small bilateral nonobstructing intrarenal stones. No ureteral stones or obstructive uropathy. Electronically Signed   By: Lajean Manes M.D.   On: 01/26/2018 16:13    Procedures Procedures (including critical care time)  Medications Ordered in ED Medications  ciprofloxacin (CIPRO) tablet 500 mg (not administered)  metroNIDAZOLE (FLAGYL) tablet 500 mg (not administered)  ketorolac (TORADOL) 30 MG/ML injection 15 mg (15 mg Intramuscular Given 01/26/18 1636)     Initial Impression / Assessment and Plan / ED Course  I have reviewed the triage vital signs and the nursing notes.  Pertinent labs & imaging results that were available during my care of the patient were reviewed by me and considered in my medical decision making (see chart for details).     5:28 PM On repeat exam the patient is awake and alert, in no distress. No new complaints, she remains afebrile, speaking clearly. We discussed all findings  including evidence for diverticulitis, and kidney stone. I offered IV antibiotics, with further monitoring, prior to discharge, versus initiation of outpatient therapy immediately. Patient voiced a preference for oral antibiotics, discharged with GI. Given the absence of evidence for abscess, peritonitis, obstructive or infected kidney stone, this is reasonable.   Final Clinical Impressions(s) / ED Diagnoses   Final diagnoses:  Diverticulitis  Nephrolithiasis    ED Discharge Orders        Ordered    ciprofloxacin (CIPRO) 500 MG tablet  2 times daily     01/26/18 1713    metroNIDAZOLE (FLAGYL) 500 MG tablet  2 times daily     01/26/18 1713    senna-docusate (SENOKOT-S) 8.6-50 MG tablet  Daily     01/26/18 1713  Carmin Muskrat, MD 01/26/18 1729

## 2018-02-10 ENCOUNTER — Ambulatory Visit: Payer: 59 | Admitting: Gastroenterology

## 2018-02-10 ENCOUNTER — Encounter: Payer: Self-pay | Admitting: Gastroenterology

## 2018-02-10 DIAGNOSIS — E890 Postprocedural hypothyroidism: Secondary | ICD-10-CM | POA: Diagnosis not present

## 2018-02-10 DIAGNOSIS — K5732 Diverticulitis of large intestine without perforation or abscess without bleeding: Secondary | ICD-10-CM | POA: Diagnosis not present

## 2018-02-10 NOTE — Assessment & Plan Note (Signed)
Recent bout of uncomplicated diverticulitis, doing well. Known history of diverticula with last colonoscopy 2015. Discussed supplemental fiber, avoidance of constipation, avoidance of NSAIDs, discussed smoking cessation. She is to call if any recurrent flares. In interim, check ifobt in next 6 weeks or so as it has been since 2015 since last evaluation. Otherwise, return in 1 year. Applauded on purposeful weight loss.

## 2018-02-10 NOTE — Progress Notes (Signed)
Referring Provider: Redmond School, MD Primary Care Physician:  Redmond School, MD Primary GI: Dr. Gala Romney   Chief Complaint  Patient presents with  . Abdominal Pain  . no appetite    no appetite for food, drinking ok    HPI:   Whitney Lopez is a 57 y.o. female presenting today with a history of GERD, dysphagia, chronic abdominal pain, with last EGD in 2017 with normal esophagus, normal duodenum, no specimens collected. 240 in March 2017, now 202. She is purposefully losing weight through [employer]. Colonoscopy last in 2015 with pancolonic diverticulosis, adenoma, due for surveillance in 2022. Gallbladder absent. Recently seen in ED Feb 2019 with mild uncomplicated diverticulitis.  She is feeling much better now. Needs a note for work. States she had an episode of diarrhea Saturday after some constipation but better now. Wants to know what to do to avoid a flare in future. Denies NSAIDs. Excited about her weight loss, and she was applauded on this. She is making lifestyle changes, using nutrisystem during the week, and blue apron. She has come off of reflux medications as reflux is resolved.     Past Medical History:  Diagnosis Date  . Abdominal bloating 01/20/2013  . Abdominal pain 02/16/2016  . Anxiety   . Bipolar disorder ([place])   . Bipolar disorder, unspecified ([place]) 10/09/2012  . Chronic cough 01/20/2013  . Constipation 02/16/2016  . Depression   . Encounter for screening colonoscopy 09/09/2014  . Epigastric pain 10/11/2011  . Erosive esophagitis 11/30/2011  . Esophageal dysphagia 03/19/2012  . Gastroesophageal reflux disease without esophagitis   . GERD (gastroesophageal reflux disease)    Erosive reflux esophagitis  . Headache(784.0)   . Hypothyroidism   . Lower extremity edema   . Unspecified constipation 09/09/2014    Past Surgical History:  Procedure Laterality Date  . CHOLECYSTECTOMY    . clavicle repair    . COLONOSCOPY N/A 09/28/2014   DR. Rourk: 1. pancolonic  diverticulosis. Rectal and colonic polyps removed, one tubular adenoma descending colon, otherwise hyperplastic. Suveillance in 2022  . ESOPHAGOGASTRODUODENOSCOPY  11/02/2011   RMR: Four-quadrant erosive reflux esophagitis, gastric and bulbar erosions, H. pylori negative  . ESOPHAGOGASTRODUODENOSCOPY N/A 03/09/2016   Dr. Gala Romney: normal esophagus, normal duodenum, no specimens collected  . MALONEY DILATION  11/02/2011   53F  . S/P Hysterectomy      Current Outpatient Medications  Medication Sig Dispense Refill  . levothyroxine (SYNTHROID, LEVOTHROID) 175 MCG tablet Take 175 mcg by mouth daily before breakfast.    . Nutritional Supplements (ESTROVEN PO) Take 1 tablet by mouth daily.     No current facility-administered medications for this visit.     Allergies as of 02/10/2018 - Review Complete 02/10/2018  Allergen Reaction Noted  . Penicillins Hives 10/11/2011    Family History  Problem Relation Age of Onset  . Ulcers Mother   . Colon cancer Neg Hx     Social History   Socioeconomic History  . Marital status: Married    Spouse name: None  . Number of children: 0  . Years of education: None  . Highest education level: None  Social Needs  . Financial resource strain: None  . Food insecurity - worry: None  . Food insecurity - inability: None  . Transportation needs - medical: None  . Transportation needs - non-medical: None  Occupational History    Employer: [employer]  Tobacco Use  . Smoking status: Current Every Day Smoker    Packs/day:  1.00    Years: 40.00    Pack years: 40.00    Types: Cigarettes  . Smokeless tobacco: Never Used  Substance and Sexual Activity  . Alcohol use: No  . Drug use: No  . Sexual activity: None  Other Topics Concern  . None  Social History Narrative  . None    Review of Systems: Gen: Denies fever, chills, anorexia. Denies fatigue, weakness, weight loss.  CV: Denies chest pain, palpitations, syncope, peripheral edema, and  claudication. Resp: Denies dyspnea at rest, cough, wheezing, coughing up blood, and pleurisy. GI: see HPI  Derm: Denies rash, itching, dry skin Psych: Denies depression, anxiety, memory loss, confusion. No homicidal or suicidal ideation.  Heme: Denies bruising, bleeding, and enlarged lymph nodes.  Physical Exam: BP 105/72   Pulse 76   Temp (!) 97.1 F (36.2 C) (Oral)   Ht 5\' 9"  (1.753 m)   Wt 202 lb (91.6 kg)   BMI 29.83 kg/m  General:   Alert and oriented. No distress noted. Pleasant and cooperative.  Head:  Normocephalic and atraumatic. Eyes:  Conjuctiva clear without scleral icterus. Mouth:  Oral mucosa pink and moist.  Abdomen:  +BS, soft, non-tender and non-distended. No rebound or guarding. No HSM or masses noted. Msk:  Symmetrical without gross deformities. Normal posture. Extremities:  Without edema. Neurologic:  Alert and  oriented x4 Psych:  Alert and cooperative. Normal mood and affect.  Lab Results  Component Value Date   WBC 8.2 01/26/2018   HGB 13.9 01/26/2018   HCT 44.2 01/26/2018   MCV 94.0 01/26/2018   PLT 248 01/26/2018   Lab Results  Component Value Date   ALT 11 (L) 01/26/2018   AST 13 (L) 01/26/2018   ALKPHOS 94 01/26/2018   BILITOT 0.5 01/26/2018

## 2018-02-10 NOTE — Patient Instructions (Addendum)
I would add supplemental fiber such as Metamucil or Benefiber daily. You can use a stool softener as needed.   Please complete the stool test in about 4 weeks or so and return to our office.  We will see you in 1 year or sooner if needed!  I am so proud and happy for your great lifestyle change! Keep up the good work!  It was a pleasure to see you today. I strive to create trusting relationships with patients to provide genuine, compassionate, and quality care. I value your feedback. If you receive a survey regarding your visit,  I greatly appreciate you taking time to fill this out.   Annitta Needs, PhD, ANP-BC [facility]    Diverticulitis Diverticulitis is infection or inflammation of small pouches (diverticula) in the colon that form due to a condition called diverticulosis. Diverticula can trap stool (feces) and bacteria, causing infection and inflammation. Diverticulitis may cause severe stomach pain and diarrhea. It may lead to tissue damage in the colon that causes bleeding. The diverticula may also burst (rupture) and cause infected stool to enter other areas of the abdomen. Complications of diverticulitis can include:  Bleeding.  Severe infection.  Severe pain.  Rupture (perforation) of the colon.  Blockage (obstruction) of the colon.  What are the causes? This condition is caused by stool becoming trapped in the diverticula, which allows bacteria to grow in the diverticula. This leads to inflammation and infection. What increases the risk? You are more likely to develop this condition if:  You have diverticulosis. The risk for diverticulosis increases if: ? You are overweight or obese. ? You use tobacco products. ? You do not get enough exercise.  You eat a diet that does not include enough fiber. High-fiber foods include fruits, vegetables, beans, nuts, and whole grains.  What are the signs or symptoms? Symptoms of this condition may  include:  Pain and tenderness in the abdomen. The pain is normally located on the left side of the abdomen, but it may occur in other areas.  Fever and chills.  Bloating.  Cramping.  Nausea.  Vomiting.  Changes in bowel routines.  Blood in your stool.  How is this diagnosed? This condition is diagnosed based on:  Your medical history.  A physical exam.  Tests to make sure there is nothing else causing your condition. These tests may include: ? Blood tests. ? Urine tests. ? Imaging tests of the abdomen, including X-rays, ultrasounds, MRIs, or CT scans.  How is this treated? Most cases of this condition are mild and can be treated at home. Treatment may include:  Taking over-the-counter pain medicines.  Following a clear liquid diet.  Taking antibiotic medicines by mouth.  Rest.  More severe cases may need to be treated at a hospital. Treatment may include:  Not eating or drinking.  Taking prescription pain medicine.  Receiving antibiotic medicines through an IV tube.  Receiving fluids and nutrition through an IV tube.  Surgery.  When your condition is under control, your health care provider may recommend that you have a colonoscopy. This is an exam to look at the entire large intestine. During the exam, a lubricated, bendable tube is inserted into the anus and then passed into the rectum, colon, and other parts of the large intestine. A colonoscopy can show how severe your diverticula are and whether something else may be causing your symptoms. Follow these instructions at home: Medicines  Take over-the-counter and prescription medicines only as told by  your health care provider. These include fiber supplements, probiotics, and stool softeners.  If you were prescribed an antibiotic medicine, take it as told by your health care provider. Do not stop taking the antibiotic even if you start to feel better.  Do not drive or use heavy machinery while taking  prescription pain medicine. General instructions  Follow a full liquid diet or another diet as directed by your health care provider. After your symptoms improve, your health care provider may tell you to change your diet. He or she may recommend that you eat a diet that contains at least 25 g (25 grams) of fiber daily. Fiber makes it easier to pass stool. Healthy sources of fiber include: ? Berries. One cup contains 4-8 grams of fiber. ? Beans or lentils. One half cup contains 5-8 grams of fiber. ? Green vegetables. One cup contains 4 grams of fiber.  Exercise for at least 30 minutes, 3 times each week. You should exercise hard enough to raise your heart rate and break a sweat.  Keep all follow-up visits as told by your health care provider. This is important. You may need a colonoscopy. Contact a health care provider if:  Your pain does not improve.  You have a hard time drinking or eating food.  Your bowel movements do not return to normal. Get help right away if:  Your pain gets worse.  Your symptoms do not get better with treatment.  Your symptoms suddenly get worse.  You have a fever.  You vomit more than one time.  You have stools that are bloody, black, or tarry. Summary  Diverticulitis is infection or inflammation of small pouches (diverticula) in the colon that form due to a condition called diverticulosis. Diverticula can trap stool (feces) and bacteria, causing infection and inflammation.  You are at higher risk for this condition if you have diverticulosis and you eat a diet that does not include enough fiber.  Most cases of this condition are mild and can be treated at home. More severe cases may need to be treated at a hospital.  When your condition is under control, your health care provider may recommend that you have an exam called a colonoscopy. This exam can show how severe your diverticula are and whether something else may be causing your symptoms. This  information is not intended to replace advice given to you by your health care provider. Make sure you discuss any questions you have with your health care provider. Document Released: 09/12/2005 Document Revised: 01/05/2017 Document Reviewed: 01/05/2017 Elsevier Interactive Patient Education  [employer].

## 2018-02-10 NOTE — Progress Notes (Signed)
cc'ed to pcp °

## 2018-02-14 DIAGNOSIS — E890 Postprocedural hypothyroidism: Secondary | ICD-10-CM | POA: Diagnosis not present

## 2018-03-13 ENCOUNTER — Ambulatory Visit (INDEPENDENT_AMBULATORY_CARE_PROVIDER_SITE_OTHER): Payer: 59 | Admitting: Gastroenterology

## 2018-03-13 DIAGNOSIS — Z79899 Other long term (current) drug therapy: Secondary | ICD-10-CM | POA: Diagnosis not present

## 2018-03-13 DIAGNOSIS — K5732 Diverticulitis of large intestine without perforation or abscess without bleeding: Secondary | ICD-10-CM | POA: Diagnosis not present

## 2018-03-13 LAB — IFOBT (OCCULT BLOOD): IFOBT: NEGATIVE

## 2018-03-17 NOTE — Progress Notes (Signed)
Heme negative. Keep follow-up for 1 year.

## 2018-03-18 NOTE — Progress Notes (Signed)
LMOM to call.

## 2018-03-19 NOTE — Progress Notes (Signed)
PT is aware.

## 2018-04-16 DIAGNOSIS — E890 Postprocedural hypothyroidism: Secondary | ICD-10-CM | POA: Diagnosis not present

## 2018-07-09 ENCOUNTER — Emergency Department (HOSPITAL_COMMUNITY): Payer: 59

## 2018-07-09 ENCOUNTER — Encounter (HOSPITAL_COMMUNITY): Payer: Self-pay | Admitting: *Deleted

## 2018-07-09 ENCOUNTER — Emergency Department (HOSPITAL_COMMUNITY)
Admission: EM | Admit: 2018-07-09 | Discharge: 2018-07-09 | Disposition: A | Discharge status: Home / Self Care | Payer: 59 | Attending: Emergency Medicine | Admitting: Emergency Medicine

## 2018-07-09 ENCOUNTER — Other Ambulatory Visit: Payer: Self-pay

## 2018-07-09 DIAGNOSIS — R638 Other symptoms and signs concerning food and fluid intake: Secondary | ICD-10-CM | POA: Diagnosis not present

## 2018-07-09 DIAGNOSIS — M791 Myalgia, unspecified site: Secondary | ICD-10-CM | POA: Diagnosis not present

## 2018-07-09 DIAGNOSIS — E039 Hypothyroidism, unspecified: Secondary | ICD-10-CM | POA: Diagnosis not present

## 2018-07-09 DIAGNOSIS — F17210 Nicotine dependence, cigarettes, uncomplicated: Secondary | ICD-10-CM | POA: Diagnosis not present

## 2018-07-09 DIAGNOSIS — Z79899 Other long term (current) drug therapy: Secondary | ICD-10-CM | POA: Insufficient documentation

## 2018-07-09 DIAGNOSIS — R1084 Generalized abdominal pain: Secondary | ICD-10-CM | POA: Diagnosis not present

## 2018-07-09 DIAGNOSIS — N132 Hydronephrosis with renal and ureteral calculous obstruction: Secondary | ICD-10-CM | POA: Diagnosis not present

## 2018-07-09 DIAGNOSIS — R109 Unspecified abdominal pain: Secondary | ICD-10-CM | POA: Diagnosis not present

## 2018-07-09 LAB — COMPREHENSIVE METABOLIC PANEL
ALBUMIN: 4 g/dL (ref 3.5–5.0)
ALK PHOS: 76 U/L (ref 38–126)
ALT: 12 U/L (ref 0–44)
ANION GAP: 7 (ref 5–15)
AST: 12 U/L — ABNORMAL LOW (ref 15–41)
BILIRUBIN TOTAL: 0.3 mg/dL (ref 0.3–1.2)
BUN: 19 mg/dL (ref 6–20)
CALCIUM: 9.2 mg/dL (ref 8.9–10.3)
CO2: 28 mmol/L (ref 22–32)
Chloride: 105 mmol/L (ref 98–111)
Creatinine, Ser: 0.82 mg/dL (ref 0.44–1.00)
GLUCOSE: 94 mg/dL (ref 70–99)
Potassium: 4.5 mmol/L (ref 3.5–5.1)
Sodium: 140 mmol/L (ref 135–145)
TOTAL PROTEIN: 7.1 g/dL (ref 6.5–8.1)

## 2018-07-09 LAB — LIPASE, BLOOD: Lipase: 34 U/L (ref 11–51)

## 2018-07-09 LAB — URINALYSIS, ROUTINE W REFLEX MICROSCOPIC
BILIRUBIN URINE: NEGATIVE
Bacteria, UA: NONE SEEN
GLUCOSE, UA: NEGATIVE mg/dL
KETONES UR: NEGATIVE mg/dL
Leukocytes, UA: NEGATIVE
NITRITE: NEGATIVE
PH: 7 (ref 5.0–8.0)
Protein, ur: NEGATIVE mg/dL
Specific Gravity, Urine: 1.004 — ABNORMAL LOW (ref 1.005–1.030)

## 2018-07-09 LAB — CBC
HCT: 42.4 % (ref 36.0–46.0)
HEMOGLOBIN: 13.8 g/dL (ref 12.0–15.0)
MCH: 30.8 pg (ref 26.0–34.0)
MCHC: 32.5 g/dL (ref 30.0–36.0)
MCV: 94.6 fL (ref 78.0–100.0)
Platelets: 218 10*3/uL (ref 150–400)
RBC: 4.48 MIL/uL (ref 3.87–5.11)
RDW: 13 % (ref 11.5–15.5)
WBC: 9.8 10*3/uL (ref 4.0–10.5)

## 2018-07-09 MED ORDER — OXYCODONE-ACETAMINOPHEN 5-325 MG PO TABS
1.0000 | ORAL_TABLET | Freq: Once | ORAL | Status: AC
  Administered 2018-07-09: 1 via ORAL
  Filled 2018-07-09: qty 1

## 2018-07-09 NOTE — ED Provider Notes (Addendum)
[facility] EMERGENCY DEPARTMENT Provider Note   CSN: [id number] Arrival date & time: 07/09/18  1142     History   Chief Complaint Chief Complaint  Patient presents with  . Abdominal Pain    HPI Whitney Lopez is a 57 y.o. female.  HPI   She presents for evaluation of abdominal pain present for several days, associated with decreased appetite, and general achiness.  She denies fever, chills, nausea or vomiting.  She has been constipated recently.  She has not had any dysuria, urinary frequency or hematuria.  She states that the pain feels like "menstrual cramps.  She had a hysterectomy 20 years ago.  She has had similar pain previously when she had both kidney stones and diverticulitis.  There are no other known modifying factors.  Past Medical History:  Diagnosis Date  . Abdominal bloating 01/20/2013  . Abdominal pain 02/16/2016  . Anxiety   . Bipolar disorder ([place])   . Bipolar disorder, unspecified ([place]) 10/09/2012  . Chronic cough 01/20/2013  . Constipation 02/16/2016  . Depression   . Encounter for screening colonoscopy 09/09/2014  . Epigastric pain 10/11/2011  . Erosive esophagitis 11/30/2011  . Esophageal dysphagia 03/19/2012  . Gastroesophageal reflux disease without esophagitis   . GERD (gastroesophageal reflux disease)    Erosive reflux esophagitis  . Headache(784.0)   . Hypothyroidism   . Lower extremity edema   . Unspecified constipation 09/09/2014    Patient Active Problem List   Diagnosis Date Noted  . Diverticulitis of colon 02/10/2018  . Right ureteral stone 03/17/2016  . Dyspareunia in female 03/17/2016  . Gastroesophageal reflux disease without esophagitis   . Abdominal pain 02/16/2016  . Constipation 02/16/2016  . Unspecified constipation 09/09/2014  . Solitary pulmonary nodule 09/09/2014  . Encounter for screening colonoscopy 09/09/2014  . Abdominal bloating 01/20/2013  . Chronic cough 01/20/2013  . Bipolar disorder, unspecified ([place]) 10/09/2012  .  Esophageal dysphagia 03/19/2012  . Erosive esophagitis 11/30/2011  . Epigastric pain 10/11/2011    Past Surgical History:  Procedure Laterality Date  . CHOLECYSTECTOMY    . clavicle repair    . COLONOSCOPY N/A 09/28/2014   DR. Rourk: 1. pancolonic diverticulosis. Rectal and colonic polyps removed, one tubular adenoma descending colon, otherwise hyperplastic. Suveillance in 2022  . ESOPHAGOGASTRODUODENOSCOPY  11/02/2011   RMR: Four-quadrant erosive reflux esophagitis, gastric and bulbar erosions, H. pylori negative  . ESOPHAGOGASTRODUODENOSCOPY N/A 03/09/2016   Dr. Gala Romney: normal esophagus, normal duodenum, no specimens collected  . MALONEY DILATION  11/02/2011   30F  . S/P Hysterectomy       OB History   None      Home Medications    Prior to Admission medications   Medication Sig Start Date End Date Taking? Authorizing Provider  docusate sodium (COLACE) 100 MG capsule Take 300 mg by mouth daily.   Yes [provider]  FLUoxetine (PROZAC) 20 MG capsule Take 20 mg by mouth daily.  06/25/18  Yes [provider]  LATUDA 20 MG TABS tablet Take 20 mg by mouth daily.  06/25/18  Yes [provider]  levothyroxine (SYNTHROID, LEVOTHROID) 175 MCG tablet Take 175 mcg by mouth daily before breakfast.   Yes [provider]  Nutritional Supplements (ESTROVEN PO) Take 1 tablet by mouth daily.   Yes [provider]  risperiDONE (RISPERDAL) 2 MG tablet TAKE 1 TABLET BY MOUTH EVERYDAY AT BEDTIME 06/25/18  Yes [provider]  traZODone (DESYREL) 50 MG tablet Take 25-100 mg by mouth  at bedtime.  07/07/18  Yes [provider]    Family History Family History  Problem Relation Age of Onset  . Ulcers Mother   . Colon cancer Neg Hx     Social History Social History   Tobacco Use  . Smoking status: Current Every Day Smoker    Packs/day: 1.00    Years: 40.00    Pack years: 40.00    Types: Cigarettes  . Smokeless tobacco: Never  Used  Substance Use Topics  . Alcohol use: No  . Drug use: No     Allergies   Penicillins   Review of Systems Review of Systems  All other systems reviewed and are negative.    Physical Exam Updated Vital Signs BP 100/64 (BP Location: Right Arm)   Pulse (!) 58   Temp 97.7 F (36.5 C) (Oral)   Resp 16   Ht 5\' 9"  (1.753 m)   Wt 92.5 kg (204 lb)   SpO2 99%   BMI 30.13 kg/m   Physical Exam  Constitutional: She is oriented to person, place, and time. She appears well-developed.  Overweight  HENT:  Head: Normocephalic and atraumatic.  Eyes: Pupils are equal, round, and reactive to light. Conjunctivae and EOM are normal.  Neck: Normal range of motion and phonation normal. Neck supple.  Cardiovascular: Normal rate and regular rhythm.  Pulmonary/Chest: Effort normal and breath sounds normal. She exhibits no tenderness.  Abdominal: Soft. She exhibits no distension. There is no tenderness (Disease, mild). There is no rebound and no guarding. Hernia confirmed negative in the ventral area.  Musculoskeletal: Normal range of motion.  Neurological: She is alert and oriented to person, place, and time. She exhibits normal muscle tone.  Skin: Skin is warm and dry.  Psychiatric: She has a normal mood and affect. Her behavior is normal. Judgment and thought content normal.  Nursing note and vitals reviewed.    ED Treatments / Results  Labs (all labs ordered are listed, but only abnormal results are displayed) Labs Reviewed  COMPREHENSIVE METABOLIC PANEL - Abnormal; Notable for the following components:      Result Value   AST 12 (*)    All other components within normal limits  URINALYSIS, ROUTINE W REFLEX MICROSCOPIC - Abnormal; Notable for the following components:   Color, Urine STRAW (*)    Specific Gravity, Urine 1.004 (*)    Hgb urine dipstick SMALL (*)    All other components within normal limits  LIPASE, BLOOD  CBC    EKG None  Radiology Ct Renal Stone  Study  Result Date: 07/09/2018 CLINICAL DATA:  Acute mid abdominal pain. EXAM: CT ABDOMEN AND PELVIS WITHOUT CONTRAST TECHNIQUE: Multidetector CT imaging of the abdomen and pelvis was performed following the standard protocol without IV contrast. COMPARISON:  CT scan of January 26, 2018. FINDINGS: Lower chest: No acute abnormality. Hepatobiliary: No focal liver abnormality is seen. Status post cholecystectomy. No biliary dilatation. Pancreas: Unremarkable. No pancreatic ductal dilatation or surrounding inflammatory changes. Spleen: Stable calcified splenic granulomata are noted. Adrenals/Urinary Tract: Adrenal glands appear normal. Minimal bilateral nephrolithiasis is noted. Minimal right hydroureteronephrosis is noted without obstructing calculus. Urinary bladder is unremarkable. Stomach/Bowel: Stomach is within normal limits. Appendix appears normal. No evidence of bowel wall thickening, distention, or inflammatory changes. Sigmoid diverticulosis is noted without inflammation. Vascular/Lymphatic: No significant vascular findings are present. No enlarged abdominal or pelvic lymph nodes. Reproductive: Status post hysterectomy. No adnexal masses. Other: No abdominal wall hernia or abnormality. No abdominopelvic ascites. Musculoskeletal:  No acute or significant osseous findings. IMPRESSION: Minimal bilateral nephrolithiasis. Minimal right hydroureteronephrosis is noted without obstructing ureteral calculus, which may be due to distal ureteral spasm secondary to recently passed stone. Sigmoid diverticulosis without inflammation. Electronically Signed   By: Marijo Conception, M.D.   On: 07/09/2018 17:16    Procedures Procedures (including critical care time)  Medications Ordered in ED Medications  oxyCODONE-acetaminophen (PERCOCET/ROXICET) 5-325 MG per tablet 1 tablet (1 tablet Oral Given 07/09/18 1651)     Initial Impression / Assessment and Plan / ED Course  I have reviewed the triage vital signs and the  nursing notes.  Pertinent labs & imaging results that were available during my care of the patient were reviewed by me and considered in my medical decision making (see chart for details).  Clinical Course as of Jul 09 1734  Wed Jul 09, 2018  1550 Initial lab testing does not support a diagnosis therefore renal CT ordered, as most likely source for her discomfort.   [EW]  1721 Normal except AST low  Comprehensive metabolic panel(!) [EW]  6389 Normal  Lipase, blood [EW]  1721 normal  CBC [EW]  1722 Normal except small amount of hemoglobin  Urinalysis, Routine w reflex microscopic(!) [EW]  1722 No evidence for diverticulitis, mild right-sided hydronephrosis, without visible stone, images reviewed by me.  CT Renal Laren Everts [EW]    Clinical Course User Index [EW] Daleen Bo, MD      Patient Vitals for the past 24 hrs:  BP Temp Temp src Pulse Resp SpO2 Height Weight  07/09/18 1344 100/64 - - (!) 58 16 99 % - -  07/09/18 1156 107/74 97.7 F (36.5 C) Oral 63 16 100 % 5\' 9"  (1.753 m) 92.5 kg (204 lb)    3:50 PM Reevaluation with update and discussion. After initial assessment and treatment, an updated evaluation reveals patient remains comfortable and has no further complaints.  Findings discussed and questions answered. [place] Decision Making: Nonspecific lower abdominal pain, with reassuring evaluation.  Doubt diverticulitis, obstructing kidney stone, UTI or metabolic instability.  CRITICAL CARE-no Performed by: Daleen Bo   Nursing Notes Reviewed/ Care Coordinated Applicable Imaging Reviewed Interpretation of Laboratory Data incorporated into ED treatment  The patient appears reasonably screened and/or stabilized for discharge and I doubt any other medical condition or other [facility] requiring further screening, evaluation, or treatment in the ED at this time prior to discharge.  Plan: Home Medications-OTC analgesia; Home Treatments-rest, fluids; return  here if the recommended treatment, does not improve the symptoms; Recommended follow up-PCP, prn    Final Clinical Impressions(s) / ED Diagnoses   Final diagnoses:  Abdominal pain, unspecified abdominal location    ED Discharge Orders    None       Daleen Bo, MD 07/09/18 1736    Daleen Bo, MD 07/21/18 [phone]

## 2018-07-09 NOTE — ED Triage Notes (Signed)
Abdominal pain, his tory of diverticulitis 

## 2018-07-09 NOTE — Discharge Instructions (Addendum)
There was no sign of obstructing kidney stone or diverticulitis on CT at this time.  Use Tylenol or Motrin as needed for pain.  Return here, if needed, for problems.

## 2019-01-05 ENCOUNTER — Encounter: Payer: Self-pay | Admitting: Internal Medicine

## 2019-02-23 DIAGNOSIS — E890 Postprocedural hypothyroidism: Secondary | ICD-10-CM | POA: Diagnosis not present

## 2019-03-02 DIAGNOSIS — E890 Postprocedural hypothyroidism: Secondary | ICD-10-CM | POA: Diagnosis not present

## 2019-12-19 IMAGING — CT CT RENAL STONE PROTOCOL
2 of 4 series · 16 of 46 positions shown, 18 images · non-contrast
Comparison: 02/21/2016

CLINICAL DATA: Patient c/o left abd pain x3. Patient states nausea.
Denies any vomiting, fevers, or diarrhea. Per patient has not had BM
in 2 days. Patient also reports urgency to urinate with only little
amounts of urine at a time

EXAM:
CT ABDOMEN AND PELVIS WITHOUT CONTRAST
TECHNIQUE: Multidetector CT imaging of the abdomen and pelvis was performed
following the standard protocol without IV contrast.

[Series 2: axial st · axial · 0.80mm/px · z∈[+1063,+1508]mm · 13 of 97 slices shown, 15 images]
[im 4/97  soft-tissue]
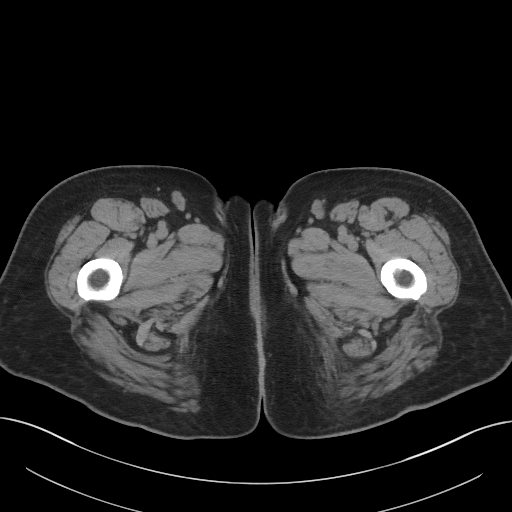
[im 4/97  bone]
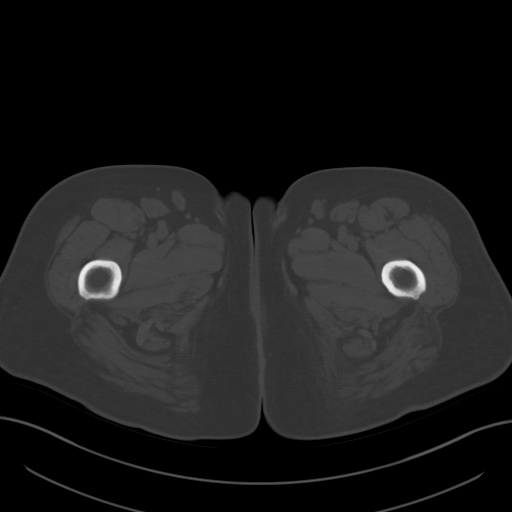
[im 12/97  soft-tissue]
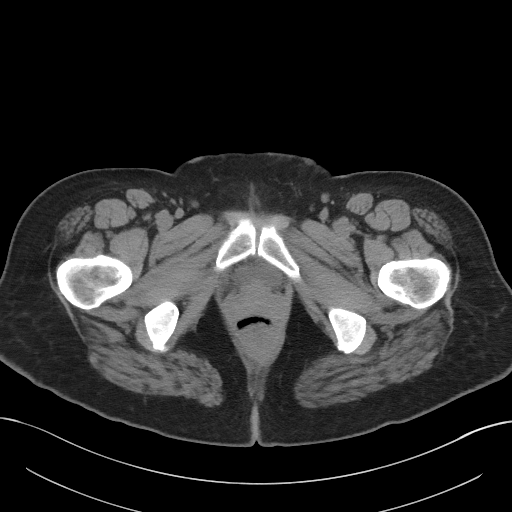
[im 20/97  soft-tissue]
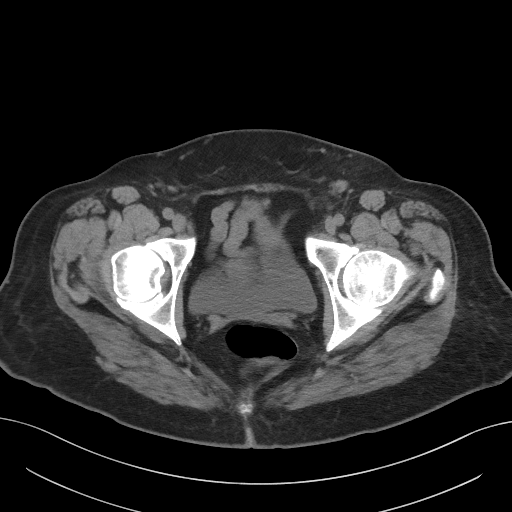
[im 27/97  soft-tissue]
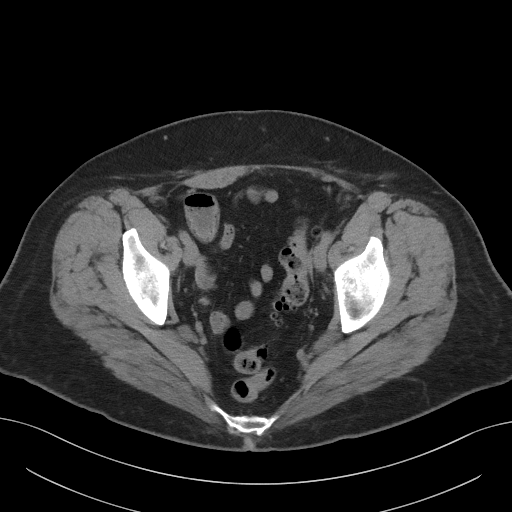
[im 35/97  soft-tissue]
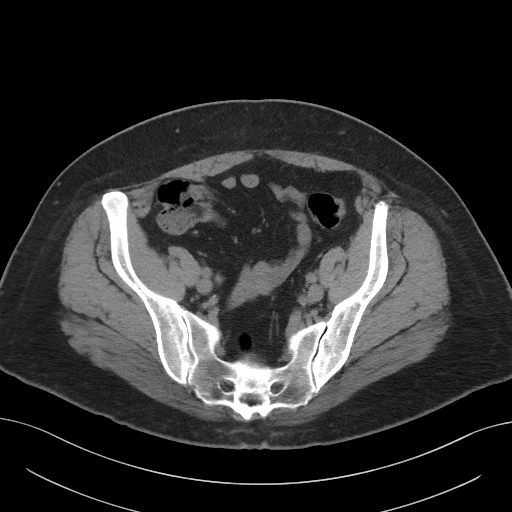
[im 43/97  soft-tissue]
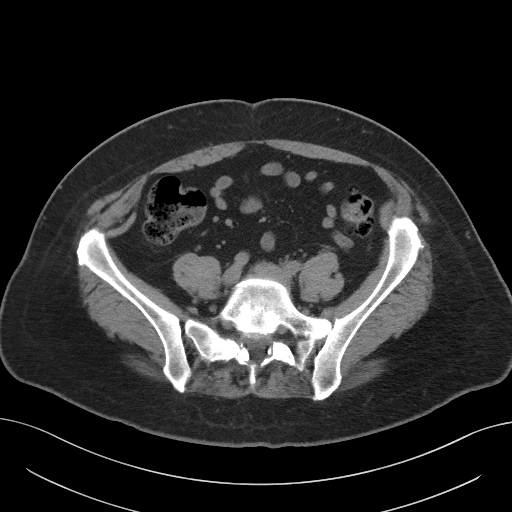
[im 50/97  soft-tissue]
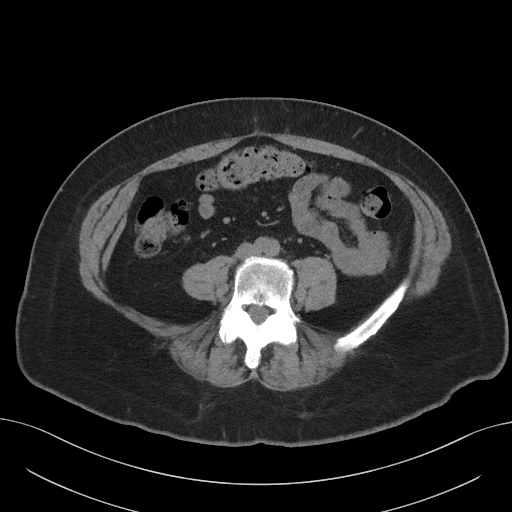
[im 54/97  soft-tissue]
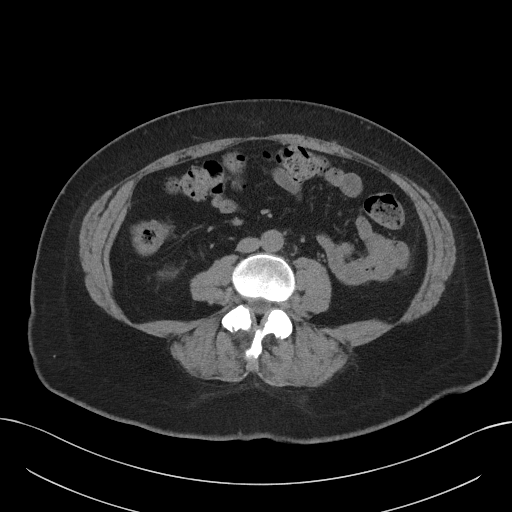
[im 62/97  soft-tissue]
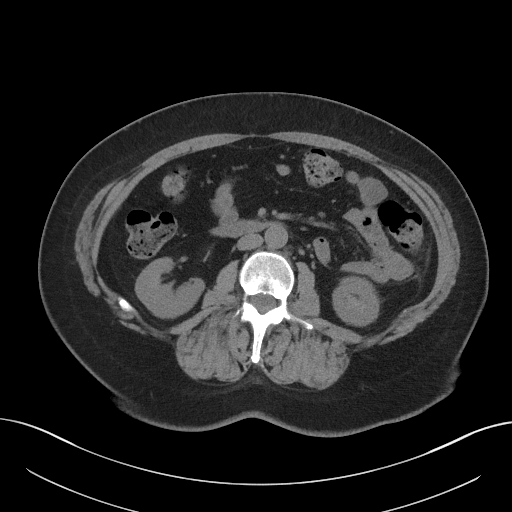
[im 62/97  bone]
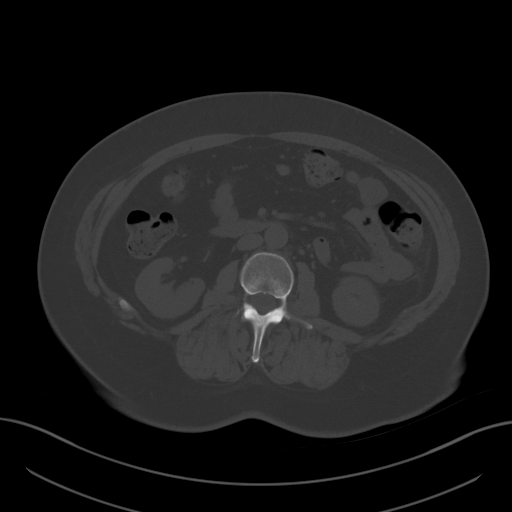
[im 70/97  soft-tissue]
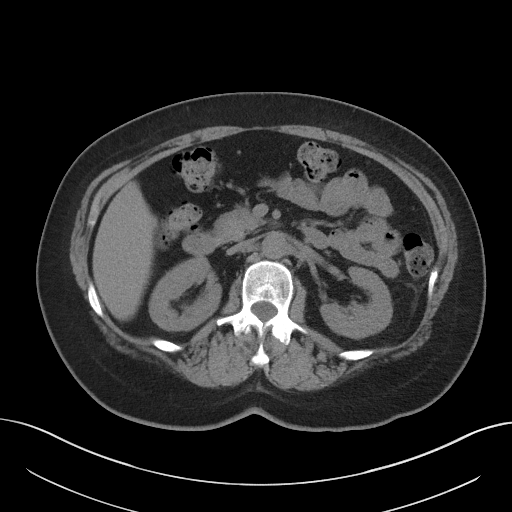
[im 77/97  soft-tissue]
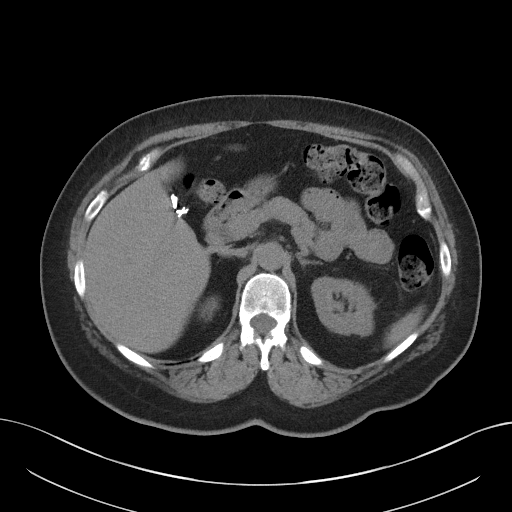
[im 85/97  soft-tissue]
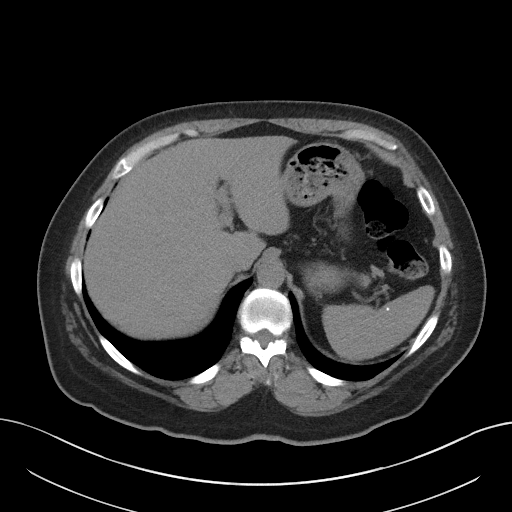
[im 93/97  soft-tissue]
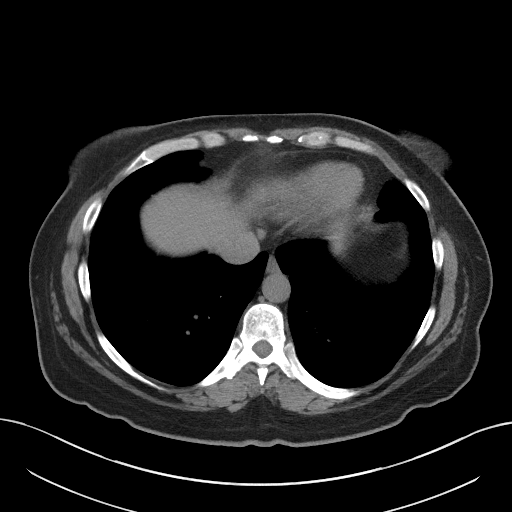

[Series 5: coronal st · coronal · 0.81mm/px · 3 of 91 slices shown]
[im 31/91  soft-tissue]
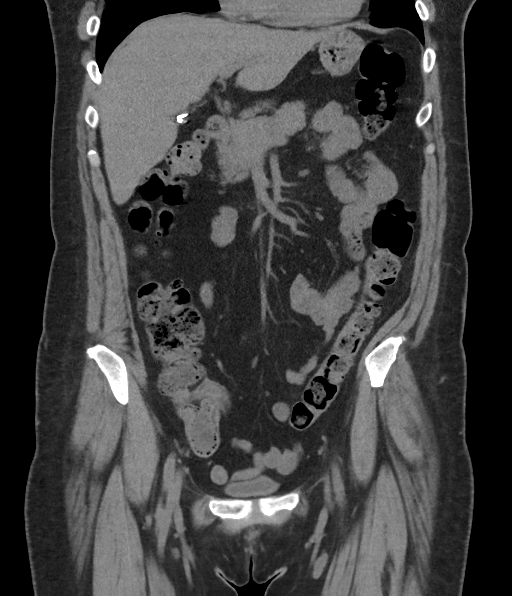
[im 41/91  soft-tissue]
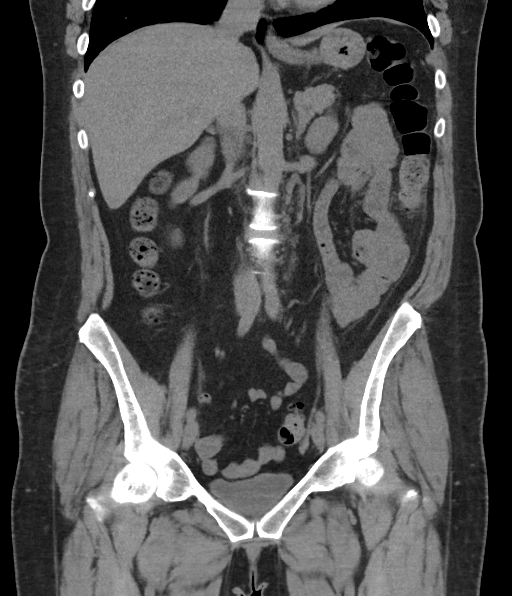
[im 51/91  soft-tissue]
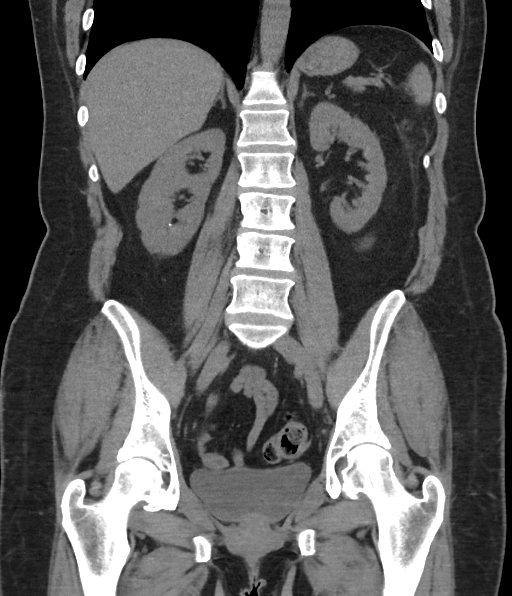

[16 of 46 positions shown; findings below may reference images not displayed]

FINDINGS: Lower chest: No acute abnormality.

Hepatobiliary: No focal liver abnormality is seen. Status post
cholecystectomy. No biliary dilatation.

Pancreas: Unremarkable. No pancreatic ductal dilatation or
surrounding inflammatory changes.

Spleen: Multiple splenic calcifications consistent with healed
granuloma. Spleen normal in size. No masses.

Adrenals/Urinary Tract: No adrenal masses.

Kidneys normal in size, orientation and position. No renal masses.
There are small bilateral nonobstructing stones. No hydronephrosis.
Ureters normal in course and in caliber. Bladder is unremarkable.

Stomach/Bowel: There is mild inflammation adjacent to a diverticulum
along the posterior aspect of the mid descending colon. There is no
extraluminal air. There is no fluid collection to suggest an
abscess. There are scattered diverticula elsewhere along the left
colon, mostly along the sigmoid, with no other inflammatory changes.
Remainder of the colon is unremarkable. Normal appendix is
visualized. Stomach and small bowel are within normal limits.

Vascular/Lymphatic: No vascular abnormality. There are prominent
inguinal lymph nodes, larger on the left. Largest is 13 mm in short
axis. No other adenopathy.

Reproductive: Status post hysterectomy. No adnexal masses.

Other: There is a small skin lesion in the left inguinal region with
adjacent inflammation, new since the prior CT. No abdominal wall
hernia. No ascites.

Musculoskeletal: No fracture or acute finding. No osteoblastic or
osteolytic lesions.
IMPRESSION: 1. Mild uncomplicated diverticulitis involving a single diverticulum
along the posterior mid descending colon. No extraluminal air. No
abscess.
2. Small skin lesion in the left inguinal region with associated
subcutaneous fat inflammation. There are prominent inguinal lymph
nodes, larger on the left, presumed reactive.
3. No other acute abnormalities within the abdomen or pelvis.
4. Small bilateral nonobstructing intrarenal stones. No ureteral
stones or obstructive uropathy.

## 2020-05-31 IMAGING — CT CT RENAL STONE PROTOCOL
2 of 4 series · 16 of 46 positions shown, 18 images · non-contrast
Comparison: CT scan of January 26, 2018.

CLINICAL DATA: Acute mid abdominal pain.

EXAM:
CT ABDOMEN AND PELVIS WITHOUT CONTRAST
TECHNIQUE: Multidetector CT imaging of the abdomen and pelvis was performed
following the standard protocol without IV contrast.

[Series 2: axial st · axial · 0.78mm/px · z∈[-597,-157]mm · 13 of 98 slices shown, 15 images]
[im 5/98  soft-tissue]
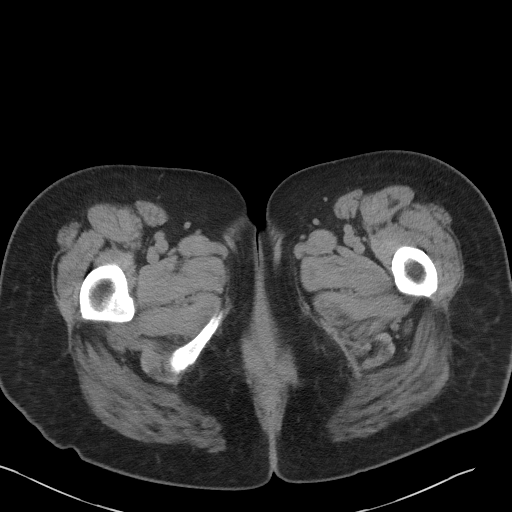
[im 5/98  bone]
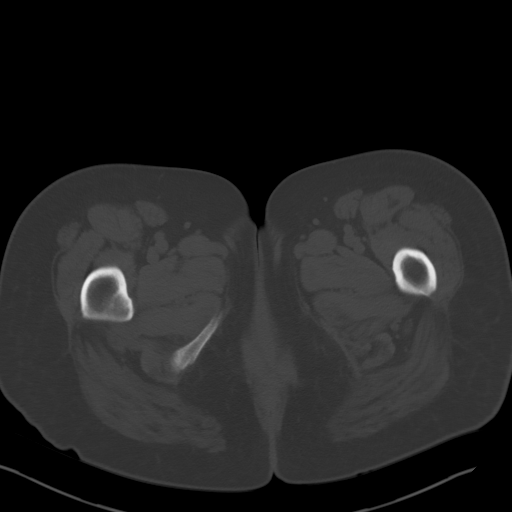
[im 13/98  soft-tissue]
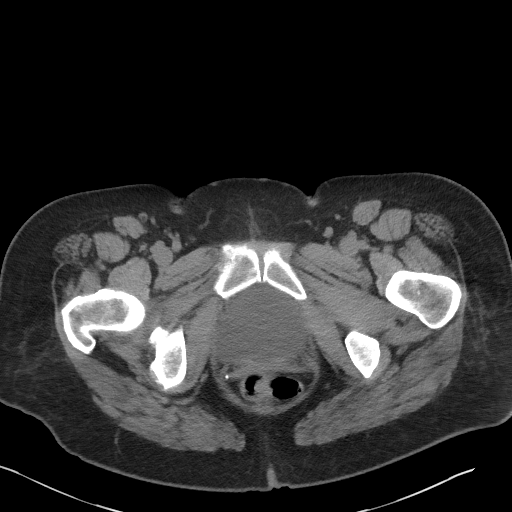
[im 21/98  soft-tissue]
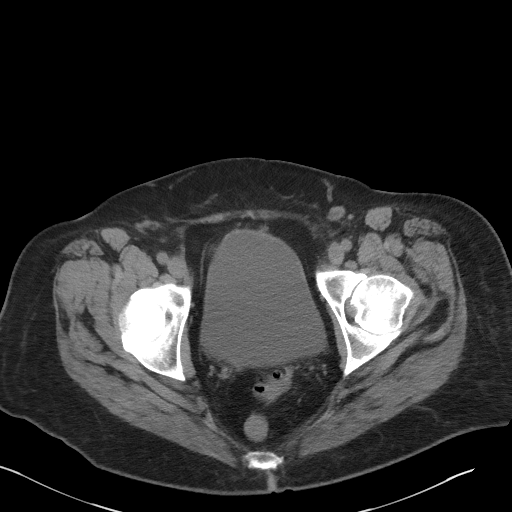
[im 29/98  soft-tissue]
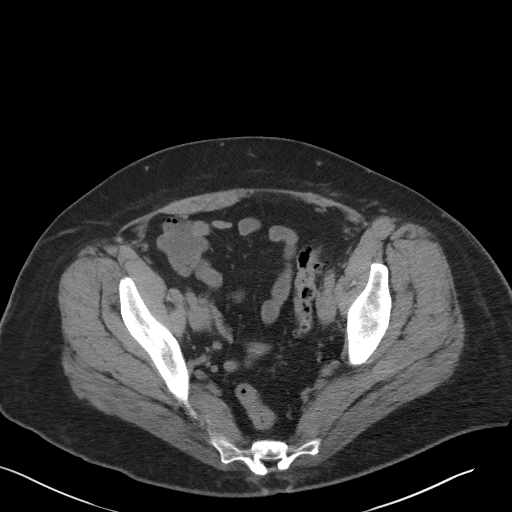
[im 33/98  soft-tissue]
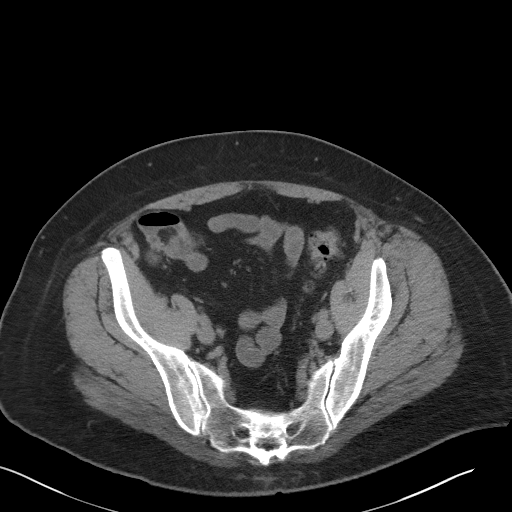
[im 41/98  soft-tissue]
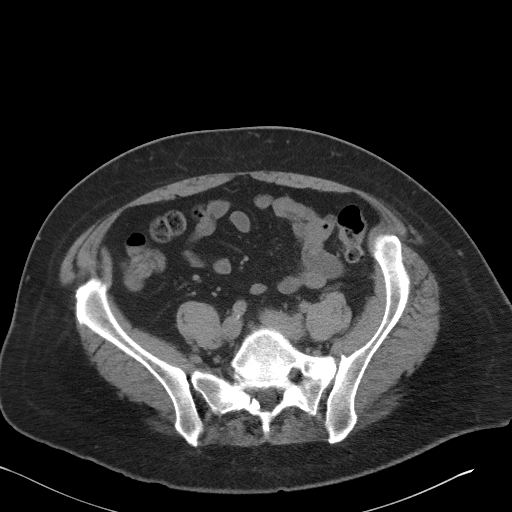
[im 49/98  soft-tissue]
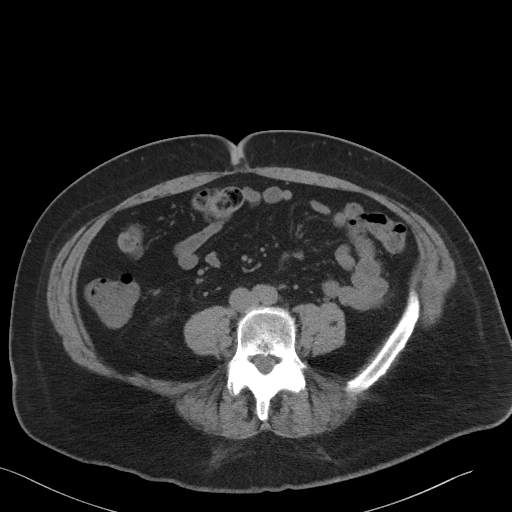
[im 57/98  soft-tissue]
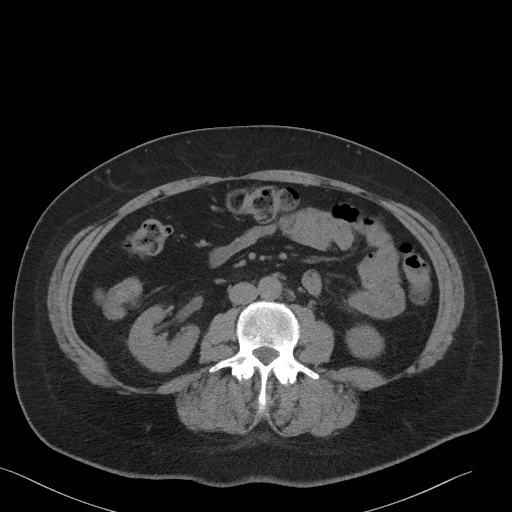
[im 65/98  soft-tissue]
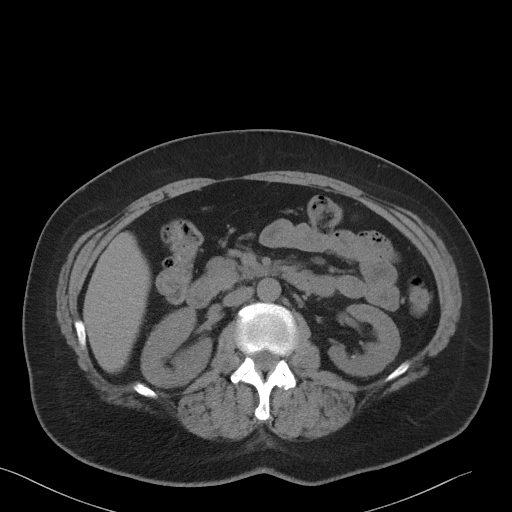
[im 65/98  bone]
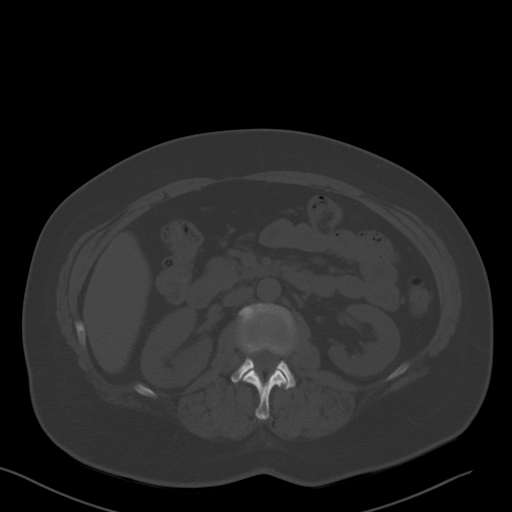
[im 69/98  soft-tissue]
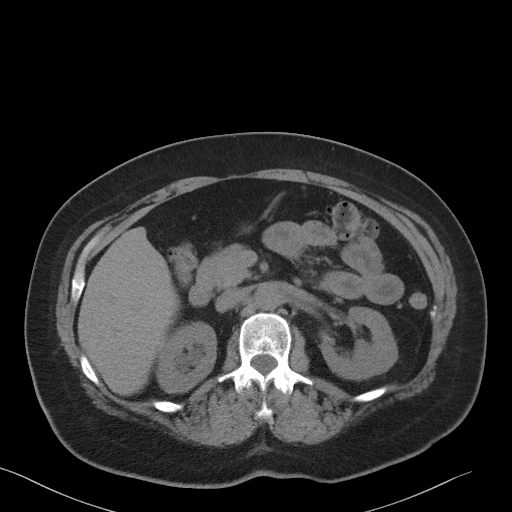
[im 77/98  soft-tissue]
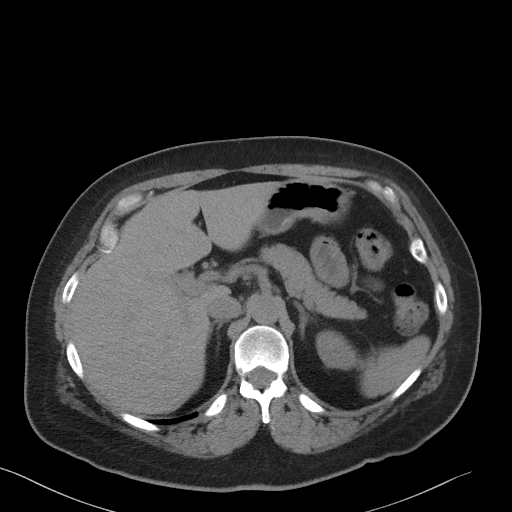
[im 85/98  soft-tissue]
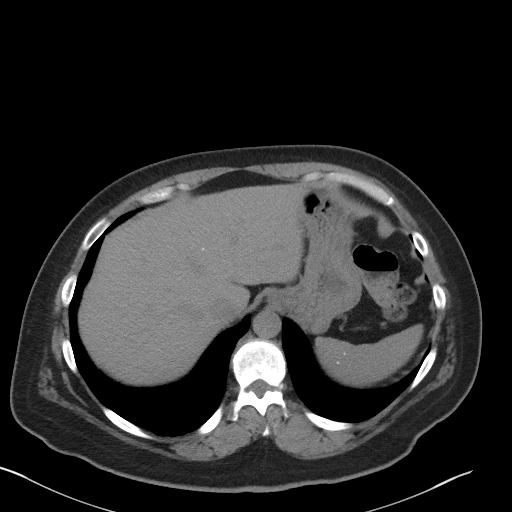
[im 93/98  soft-tissue]
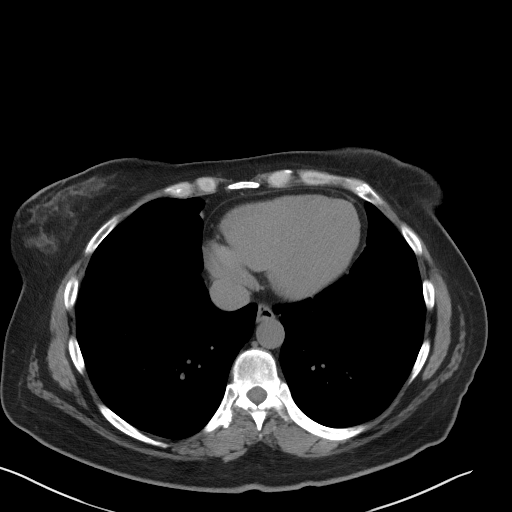

[Series 5: coronal st · coronal · 0.77mm/px · 3 of 98 slices shown]
[im 33/98  soft-tissue]
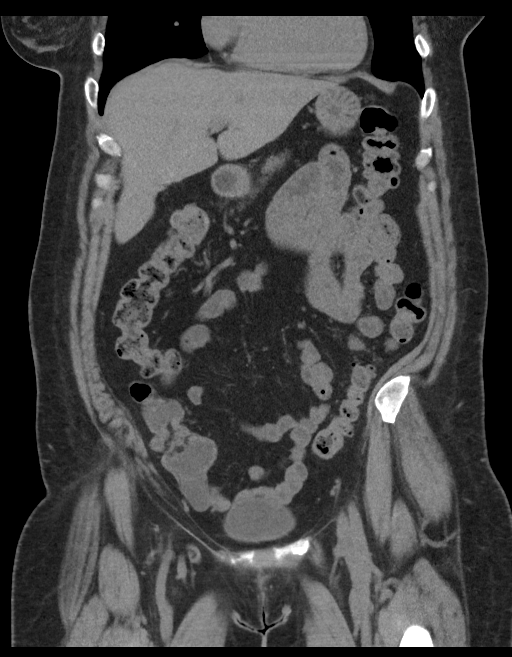
[im 44/98  soft-tissue]
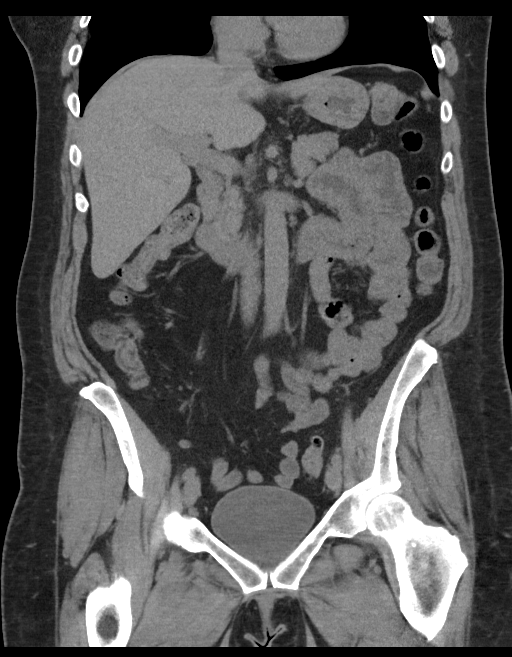
[im 54/98  soft-tissue]
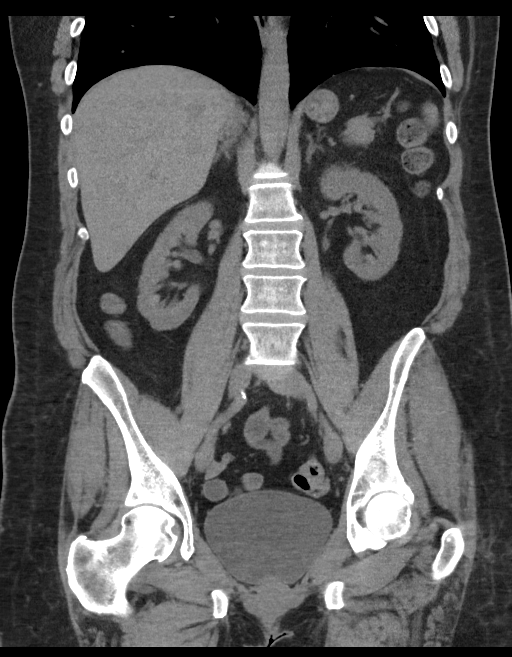

[16 of 46 positions shown; findings below may reference images not displayed]

FINDINGS: Lower chest: No acute abnormality.

Hepatobiliary: No focal liver abnormality is seen. Status post
cholecystectomy. No biliary dilatation.

Pancreas: Unremarkable. No pancreatic ductal dilatation or
surrounding inflammatory changes.

Spleen: Stable calcified splenic granulomata are noted.

Adrenals/Urinary Tract: Adrenal glands appear normal. Minimal
bilateral nephrolithiasis is noted. Minimal right
hydroureteronephrosis is noted without obstructing calculus. Urinary
bladder is unremarkable.

Stomach/Bowel: Stomach is within normal limits. Appendix appears
normal. No evidence of bowel wall thickening, distention, or
inflammatory changes. Sigmoid diverticulosis is noted without
inflammation.

Vascular/Lymphatic: No significant vascular findings are present. No
enlarged abdominal or pelvic lymph nodes.

Reproductive: Status post hysterectomy. No adnexal masses.

Other: No abdominal wall hernia or abnormality. No abdominopelvic
ascites.

Musculoskeletal: No acute or significant osseous findings.
IMPRESSION: Minimal bilateral nephrolithiasis. Minimal right
hydroureteronephrosis is noted without obstructing ureteral
calculus, which may be due to distal ureteral spasm secondary to
recently passed stone.

Sigmoid diverticulosis without inflammation.

## 2021-08-28 ENCOUNTER — Encounter: Payer: Self-pay | Admitting: *Deleted
